# Patient Record
Sex: Female | Born: 1955
Health system: Southern US, Community
[De-identification: ages and names within clinical notes are randomized; demographics above are authoritative.]

## PROBLEM LIST (undated history)

## (undated) DIAGNOSIS — Z8489 Family history of other specified conditions: Secondary | ICD-10-CM

## (undated) DIAGNOSIS — F329 Major depressive disorder, single episode, unspecified: Secondary | ICD-10-CM

## (undated) DIAGNOSIS — F419 Anxiety disorder, unspecified: Secondary | ICD-10-CM

## (undated) DIAGNOSIS — B009 Herpesviral infection, unspecified: Secondary | ICD-10-CM

## (undated) DIAGNOSIS — M719 Bursopathy, unspecified: Secondary | ICD-10-CM

## (undated) DIAGNOSIS — C801 Malignant (primary) neoplasm, unspecified: Secondary | ICD-10-CM

## (undated) DIAGNOSIS — S42409A Unspecified fracture of lower end of unspecified humerus, initial encounter for closed fracture: Secondary | ICD-10-CM

## (undated) DIAGNOSIS — S329XXA Fracture of unspecified parts of lumbosacral spine and pelvis, initial encounter for closed fracture: Secondary | ICD-10-CM

## (undated) DIAGNOSIS — M069 Rheumatoid arthritis, unspecified: Secondary | ICD-10-CM

## (undated) DIAGNOSIS — F32A Depression, unspecified: Secondary | ICD-10-CM

## (undated) DIAGNOSIS — M810 Age-related osteoporosis without current pathological fracture: Secondary | ICD-10-CM

## (undated) HISTORY — DX: Age-related osteoporosis without current pathological fracture: M81.0

## (undated) HISTORY — DX: Bursopathy, unspecified: M71.9

## (undated) HISTORY — DX: Herpesviral infection, unspecified: B00.9

## (undated) HISTORY — PX: SPINE SURGERY: SHX786

## (undated) HISTORY — PX: OTHER SURGICAL HISTORY: SHX169

## (undated) HISTORY — PX: BREAST SURGERY: SHX581

## (undated) HISTORY — DX: Fracture of unspecified parts of lumbosacral spine and pelvis, initial encounter for closed fracture: S32.9XXA

## (undated) HISTORY — DX: Unspecified fracture of lower end of unspecified humerus, initial encounter for closed fracture: S42.409A

---

## 1997-05-17 ENCOUNTER — Ambulatory Visit (HOSPITAL_COMMUNITY): Admission: RE | Admit: 1997-05-17 | Discharge: 1997-05-17 | Payer: Self-pay | Admitting: *Deleted

## 1997-06-04 ENCOUNTER — Other Ambulatory Visit: Admission: RE | Admit: 1997-06-04 | Discharge: 1997-06-04 | Payer: Self-pay | Admitting: *Deleted

## 2003-03-20 ENCOUNTER — Other Ambulatory Visit: Admission: RE | Admit: 2003-03-20 | Discharge: 2003-03-20 | Payer: Self-pay | Admitting: *Deleted

## 2003-11-21 ENCOUNTER — Other Ambulatory Visit: Admission: RE | Admit: 2003-11-21 | Discharge: 2003-11-21 | Payer: Self-pay | Admitting: *Deleted

## 2003-12-28 ENCOUNTER — Encounter: Admission: RE | Admit: 2003-12-28 | Discharge: 2003-12-28 | Payer: Self-pay | Admitting: General Surgery

## 2004-01-02 ENCOUNTER — Encounter (INDEPENDENT_AMBULATORY_CARE_PROVIDER_SITE_OTHER): Payer: Self-pay | Admitting: *Deleted

## 2004-01-02 ENCOUNTER — Encounter: Admission: RE | Admit: 2004-01-02 | Discharge: 2004-01-02 | Payer: Self-pay | Admitting: General Surgery

## 2004-01-20 HISTORY — PX: MASTECTOMY: SHX3

## 2004-04-09 ENCOUNTER — Ambulatory Visit: Payer: Self-pay | Admitting: Oncology

## 2004-04-28 ENCOUNTER — Other Ambulatory Visit: Admission: RE | Admit: 2004-04-28 | Discharge: 2004-04-28 | Payer: Self-pay | Admitting: *Deleted

## 2005-06-03 ENCOUNTER — Other Ambulatory Visit: Admission: RE | Admit: 2005-06-03 | Discharge: 2005-06-03 | Payer: Self-pay | Admitting: *Deleted

## 2008-01-20 HISTORY — PX: OTHER SURGICAL HISTORY: SHX169

## 2008-05-03 ENCOUNTER — Emergency Department (HOSPITAL_COMMUNITY): Admission: EM | Admit: 2008-05-03 | Discharge: 2008-05-03 | Payer: Self-pay | Admitting: Emergency Medicine

## 2008-05-04 ENCOUNTER — Encounter: Payer: Self-pay | Admitting: Gynecology

## 2008-05-04 ENCOUNTER — Ambulatory Visit: Payer: Self-pay | Admitting: Gynecology

## 2008-05-04 ENCOUNTER — Other Ambulatory Visit: Admission: RE | Admit: 2008-05-04 | Discharge: 2008-05-04 | Payer: Self-pay | Admitting: Gynecology

## 2008-05-07 ENCOUNTER — Ambulatory Visit: Payer: Self-pay | Admitting: Gynecology

## 2008-05-08 ENCOUNTER — Ambulatory Visit: Payer: Self-pay | Admitting: Gynecology

## 2008-06-19 ENCOUNTER — Ambulatory Visit: Payer: Self-pay | Admitting: Gynecology

## 2008-06-22 ENCOUNTER — Ambulatory Visit: Payer: Self-pay | Admitting: Gynecology

## 2008-06-22 ENCOUNTER — Ambulatory Visit (HOSPITAL_BASED_OUTPATIENT_CLINIC_OR_DEPARTMENT_OTHER): Admission: RE | Admit: 2008-06-22 | Discharge: 2008-06-22 | Payer: Self-pay | Admitting: Gynecology

## 2008-06-22 ENCOUNTER — Encounter: Payer: Self-pay | Admitting: Gynecology

## 2008-06-29 ENCOUNTER — Ambulatory Visit: Payer: Self-pay | Admitting: Gynecology

## 2008-07-17 ENCOUNTER — Ambulatory Visit: Payer: Self-pay | Admitting: Gynecology

## 2010-06-03 NOTE — H&P (Signed)
NAME:  Carla Lamb, ROLLO                ACCOUNT NO.:  [id number]   MEDICAL RECORD NO.:  [id number]          PATIENT TYPE:  AMB   LOCATION:  [facility]                         FACILITY:  [facility]   PHYSICIAN:  Timothy P. Fontaine, M.D.DATE OF BIRTH:  1955-07-23   DATE OF ADMISSION:  DATE OF DISCHARGE:                              HISTORY & PHYSICAL   Being admitted to [facility] for surgery June 4 at 7:30  a.m.   CHIEF COMPLAINT:  Vaginal cyst, labial hypertrophy.   HISTORY OF PRESENT ILLNESS:  A 55 year old G5, P5 female, postmenopausal  x 4 years presents for annual exam.  The patient notes that she is  having dyspareunia due to left labial enlargement which has  progressively gotten worse over the past several years.  The patient  also notes that she see something protruding from her vagina when she is  going to bathroom and straining that is not painful but has not been  noticed previously.  Evaluations show a generous 2 cm left vaginal  submucosal cyst that goes to the introitus with straining and  enlargement of her left labia minora consistent with her history of  enlargement with pain with intercourse.  The patient is admitted for  excision of the vaginal cyst and left labial reduction.   PAST MEDICAL HISTORY:  Significant for rheumatoid arthritis and history  of breast cancer.   PAST SURGICAL HISTORY:  Includes a right mastectomy with reconstruction  and a bilateral bunionectomy.   CURRENT MEDICATIONS:  1. Zoloft 100 mg daily.  2. Meloxicam 7.5 mg daily.  3. Methotrexate 2.5 mg tablets 8 per week.  4. Folic acid 1 mg daily.   ALLERGIES TO MEDICATIONS:  None.   REVIEW OF SYSTEMS:  Noncontributory.   FAMILY HISTORY:  Noncontributory.   SOCIAL HISTORY:  Noncontributory.   PHYSICAL EXAM:  VITAL SIGNS:  Afebrile.  Vital signs stable.  HEENT: Normal.  LUNGS:  Clear.  CARDIAC:  Regular rate.  No rubs, murmurs or gallops.  ABDOMINAL:  Exam benign.  PELVIC:   External exam with left labial minora hypertrophy.  No specific  lesions or abnormalities, but generalized enlargement of the labia.  Vaginal exam with a submucosal left mid vaginal wall cyst approximately  2 cm across which a does protrude from introitus with straining.  Cervix  grossly normal.  Bimanual, uterus normal size, midline mobile,  nontender.  Adnexa without masses or tenderness.   ASSESSMENT:  A 55 year old G5, P57 female, postmenopausal with history  breast cancer and rheumatoid arthritis with a symptomatic vaginal cyst  she sees and feels whenever straining and a symptomatic left labial  hypertrophy with dyspareunia with intercourse.  The patient is admitted  for excision of the cyst and reduction of the labia.  I reviewed the  proposed surgery with the patient, the acute intraoperative  postoperative courses, postoperative care as well as the acute and long-  term risks.  I discussed with her with the vaginal cyst that my plan  will be to unroof the cyst, excise as much of the cyst wall as I can,  but  I will not go aggressively into the vaginal side wall to try to  excise all of the cyst.  If it does not come out easily then I will  unroof and send a good sample to pathology but leave the remaining to  granulate in and heal on its own.  I also reviewed the labial reduction  that I will attempt to match her left labia to her right labia as best  as I can, but that they will be probably asymmetrical and she  understands and accepts this.  The acute risks of the procedure were  reviewed to include the risk of infection, abscess formation requiring  reoperation, abscess drainage, hematoma formation requiring opening and  draining of hematomas, particularly with the labia, but also in the  vaginal sidewall, breakdown of the incisions postoperatively requiring  leaving it open and closure by secondary intention.  The risk of damage  to surrounding tissues including bowel, such as  the rectum, sigmoid  colon, bladder, pelvic sidewall structures, ureters and large vessels as  well as on the vulva to include damage to the peri-clitoral or clitoral  region requiring future surgeries to include bladder or bowel repair,  ureteral damage repair up to including ostomy formation.  Longer-term  issues such as persistent dyspareunia following the procedure  particularly with the labial incisions and vaginal wall incisions as  well as the potential for orgasmic dysfunction with the peri-clitoral  surgery was all discussed, understood and accepted.  The patient's  questions were answered to her satisfaction.  She is ready to proceed  with surgery.      Timothy P. Fontaine, M.D.  Electronically Signed     TPF/MEDQ  D:  06/19/2008  T:  06/19/2008  Job:  [id number]

## 2010-06-03 NOTE — Op Note (Signed)
NAME:  Carla Lamb, Carla Lamb                ACCOUNT NO.:  [id number]   MEDICAL RECORD NO.:  [id number]          PATIENT TYPE:  AMB   LOCATION:  [facility]                         FACILITY:  [facility]   PHYSICIAN:  Timothy P. Fontaine, M.D.DATE OF BIRTH:  13-Jul-1955   DATE OF PROCEDURE:  06/22/2008  DATE OF DISCHARGE:                               OPERATIVE REPORT   PREOPERATIVE DIAGNOSES:  Vaginal cyst, left labial hypertrophy,  dyspareunia.   POSTOPERATIVE DIAGNOSES:  Vaginal cyst, left labial hypertrophy,  dyspareunia.   PROCEDURE:  Excision vaginal cyst, left labial reduction.   SURGEON:  Fontaine.   ANESTHETIC:  General with local 0.25% Marcaine.   SPECIMEN:  1. Vaginal cyst.  2. Portion of left labia.   COMPLICATIONS:  None.   ESTIMATED BLOOD LOSS:  Minimal.   FINDINGS:  EUA external with left labial hypertrophy.  Vaginal exam with  large left lateral to midline vaginal cyst.  Cervix grossly normal.  Uterus normal size, midline and mobile.  Adnexa without masses.   DESCRIPTION OF PROCEDURE:  The patient was taken to the operating room,  underwent general anesthesia, was placed in the dorsal lithotomy  position, received a perineal vaginal preparation with Betadine solution  and was draped in the usual fashion.  The base of the left labia was  injected using 0.25% Marcaine approximately 2 mL total.  Attention was  then turned to the vaginal cyst to allow the Marcaine to disperse and  the vaginal cyst was inspected.  It was grasped with an Allis clamp and  at the junction of the elevation of the cyst to the surrounding vaginal  tissue the vaginal mucosa was sharply incised.  The cyst entered with  mucoid material extruding.  The cyst was incised along the margin to the  surrounding normal vagina in a circumferential manner and the vaginal  mucosa overlying with the cyst wall was excised.  The remaining  underlying cyst wall was small and it was felt most appropriate to go  ahead and  excise the remaining cyst wall.  This was undermined and the  cyst was totally excised.  Several small bleeder points were noted along  the mucosa and these were addressed with electrocautery.  A digital  rectal exam was performed to assure rectal integrity and it was noted to  be intact.  The surgeon regloved and the vaginal mucosa was then  reapproximated using 3-0 Vicryl in a running interlocking stitch  incorporating the underlying submucosal tissue to close the dead space.  The vagina was then packed with a sponge.  Attention was turned to the  labial reductive surgery.  The labia was exposed and using a needle tip  Bovie the excess labial tissue was excised to match the labia on the  right.  Interrupted 3-0 chromic sutures were then placed along the cut  surface to achieve ultimate hemostasis and reapproximate the skin.  The  vaginal packing was then removed and hemostasis was visualized at  the mucosal incision.  The patient was subsequently placed in the supine  position, awakened without difficulty and taken to the recovery room  in  good condition having tolerated the procedure well.  Both specimens from  the vaginal cyst and the labial excess tissue were sent to pathology.      Timothy P. Fontaine, M.D.  Electronically Signed     TPF/MEDQ  D:  06/22/2008  T:  06/22/2008  Job:  [id number]

## 2011-03-18 ENCOUNTER — Encounter (HOSPITAL_COMMUNITY): Payer: Self-pay | Admitting: Cardiology

## 2011-03-18 ENCOUNTER — Emergency Department (HOSPITAL_COMMUNITY)
Admission: EM | Admit: 2011-03-18 | Discharge: 2011-03-18 | Disposition: A | Discharge status: Home Health | Payer: BC Managed Care – PPO | Source: Home / Self Care | Attending: Family Medicine | Admitting: Family Medicine

## 2011-03-18 DIAGNOSIS — Z23 Encounter for immunization: Secondary | ICD-10-CM

## 2011-03-18 DIAGNOSIS — IMO0002 Reserved for concepts with insufficient information to code with codable children: Secondary | ICD-10-CM

## 2011-03-18 DIAGNOSIS — T07XXXA Unspecified multiple injuries, initial encounter: Secondary | ICD-10-CM

## 2011-03-18 HISTORY — DX: Rheumatoid arthritis, unspecified: M06.9

## 2011-03-18 MED ORDER — TETANUS-DIPHTH-ACELL PERTUSSIS 5-2.5-18.5 LF-MCG/0.5 IM SUSP
INTRAMUSCULAR | Status: AC
  Filled 2011-03-18: qty 0.5

## 2011-03-18 MED ORDER — TETANUS-DIPHTH-ACELL PERTUSSIS 5-2.5-18.5 LF-MCG/0.5 IM SUSP
0.5000 mL | Freq: Once | INTRAMUSCULAR | Status: AC
  Administered 2011-03-18: 0.5 mL via INTRAMUSCULAR

## 2011-03-18 NOTE — ED Provider Notes (Signed)
History     CSN: [id number]  Arrival date & time 03/18/11  1646   First MD Initiated Contact with Patient 03/18/11 1701      Chief Complaint  Patient presents with  . Fall  . Abrasion    (Consider location/radiation/quality/duration/timing/severity/associated sxs/prior treatment) Patient is a 56 y.o. female presenting with fall. The history is provided by the patient.  Fall The accident occurred 3 to 5 hours ago. The fall occurred while walking (thinks she tripped on her pant leg and fell onto sidewalk.,no loc, multiple abrasions.). She landed on concrete. The volume of blood lost was minimal. The point of impact was the head, right elbow, left knee and right knee. The patient is experiencing no pain. She was ambulatory at the scene. There was no alcohol use involved in the accident. Pertinent negatives include no visual change, no abdominal pain and no loss of consciousness.    Past Medical History  Diagnosis Date  . RA (rheumatoid arthritis)     Past Surgical History  Procedure Date  . Mastectomy 2006    Right  . Bunions     bilat feet    History reviewed. No pertinent family history.  History  Substance Use Topics  . Smoking status: Former Smoker    Quit date: 01/19/1978  . Smokeless tobacco: Not on file  . Alcohol Use: Yes     occas    OB History    Grav Para Term Preterm Abortions TAB SAB Ect Mult Living                  Review of Systems  Constitutional: Negative.   Cardiovascular: Negative for chest pain.  Gastrointestinal: Negative for abdominal pain.  Genitourinary: Negative for pelvic pain.  Musculoskeletal: Negative for back pain.  Skin: Positive for rash and wound.  Neurological: Negative.  Negative for loss of consciousness.    Allergies  Review of patient's allergies indicates no known allergies.  Home Medications   Current Outpatient Rx  Name Route Sig Dispense Refill  . FOLIC ACID 1 MG PO TABS Oral Take 1 mg by mouth daily. 2 tablets  once daily    . MELOXICAM 7.5 MG PO TABS Oral Take 7.5 mg by mouth daily.    Marland Kitchen METHOTREXATE 2.5 MG PO TABS Oral Take 2.5 mg by mouth once a week. Caution:Chemotherapy. Protect from light. Takes 7 tablets once weekly    . SERTRALINE HCL 25 MG PO TABS Oral Take 25 mg by mouth daily.      BP 147/93  Pulse 86  Temp(Src) 98.9 F (37.2 C) (Oral)  Resp 16  SpO2 98%  Physical Exam  Nursing note and vitals reviewed. Constitutional: She is oriented to person, place, and time. She appears well-developed and well-nourished.  HENT:  Head: Normocephalic.  Right Ear: External ear normal.  Left Ear: External ear normal.  Mouth/Throat: Oropharynx is clear and moist.       Contusion to upper lip, teeth and mandible intact. Abrasion to nose, nares intact., no bony tenderness or deformity.  Eyes: Conjunctivae are normal. Pupils are equal, round, and reactive to light.  Neck: Normal range of motion.  Cardiovascular: Normal rate and normal heart sounds.   Pulmonary/Chest: Breath sounds normal. She exhibits no tenderness.  Abdominal: Soft. Bowel sounds are normal.  Musculoskeletal: Normal range of motion.       Abrasions to knees, right elbow,left thumb, and chin.  Neurological: She is alert and oriented to person, place, and time.  Skin:  Skin is warm and dry.  Psychiatric: She has a normal mood and affect. Her behavior is normal.    ED Course  Procedures (including critical care time)  Labs Reviewed - No data to display No results found.   1. Abrasions of multiple sites       MDM          Barkley Bruns, MD 03/18/11 [phone]

## 2011-03-18 NOTE — Discharge Instructions (Signed)
Wash areas and use bacitracin ointment as needed, rinse mouth with salt water as needed, advil or tylenol for soreness, return if any problems.

## 2011-03-18 NOTE — ED Notes (Signed)
Pt fell today for unknown reason approx 2pm. Denies LOC or dizzines. Abrasions to upper lip, nose, chin, right elbow, left thumb, and bilt knees. Pt immediately cleaned abrasions with cold water. Upper lip and nose noted to be swollen. Pt reports some relief of discomfort from ice pack. Took Ibuprofen before arrival to UC. Unknown date of last tetanus.

## 2011-07-13 ENCOUNTER — Other Ambulatory Visit: Payer: Self-pay | Admitting: Neurosurgery

## 2011-08-21 ENCOUNTER — Encounter (HOSPITAL_COMMUNITY): Payer: Self-pay | Admitting: Pharmacy Technician

## 2011-08-21 NOTE — Pre-Procedure Instructions (Addendum)
20 Carla Lamb  08/21/2011   Your procedure is scheduled on:  Tuesday, August 6th.  Report to [facility] at 5:30AM.  Call this number if you have problems the morning of surgery: [phone]   Remember:   Do not eat food or anything to drink:After Midnight.     Take these medicines the morning of surgery with A SIP OF WATER: Sertraline (Zoloft).  Stop taking Meloxicam (Mobic) and any herbal medications. Do not take any Aspirin or NSAID products.   Do not wear jewelry, make-up or nail polish.  Do not wear lotions, powders, or perfumes. You may wear deodorant.  Do not shave 48 hours prior to surgery. Men may shave face and neck.  Do not bring valuables to the hospital.  Contacts, dentures or bridgework may not be worn into surgery.  Leave suitcase in the car. After surgery it may be brought to your room.  For patients admitted to the hospital, checkout time is 11:00 AM the day of discharge.   Patients discharged the day of surgery will not be allowed to drive home.  Name and phone number of your driver: na  Special Instructions: CHG Shower Use Special Wash: 1/2 bottle night before surgery and 1/2 bottle morning of surgery.   Please read over the following fact sheets that you were given: Pain Booklet, Coughing and Deep Breathing and Surgical Site Infection Prevention

## 2011-08-24 ENCOUNTER — Encounter (HOSPITAL_COMMUNITY): Payer: Self-pay

## 2011-08-24 ENCOUNTER — Encounter (HOSPITAL_COMMUNITY)
Admission: RE | Admit: 2011-08-24 | Discharge: 2011-08-24 | Disposition: A | Discharge status: Home / Self Care | Payer: BC Managed Care – PPO | Source: Ambulatory Visit | Attending: Neurosurgery | Admitting: Neurosurgery

## 2011-08-24 HISTORY — DX: Anxiety disorder, unspecified: F41.9

## 2011-08-24 HISTORY — DX: Malignant (primary) neoplasm, unspecified: C80.1

## 2011-08-24 HISTORY — DX: Major depressive disorder, single episode, unspecified: F32.9

## 2011-08-24 HISTORY — DX: Depression, unspecified: F32.A

## 2011-08-24 LAB — BASIC METABOLIC PANEL
BUN: 17 mg/dL (ref 6–23)
CO2: 28 mEq/L (ref 19–32)
Calcium: 9.7 mg/dL (ref 8.4–10.5)
Chloride: 101 mEq/L (ref 96–112)
Creatinine, Ser: 0.79 mg/dL (ref 0.50–1.10)
GFR calc Af Amer: >90 mL/min (ref >90)
GFR calc non Af Amer: >90 mL/min (ref >90)
Glucose, Bld: 94 mg/dL (ref 70–99)
Potassium: 4.7 mEq/L (ref 3.5–5.1)
Sodium: 136 mEq/L (ref 135–145)

## 2011-08-24 LAB — CBC
HCT: 42.1 % (ref 36.0–46.0)
Hemoglobin: 14.4 g/dL (ref 12.0–15.0)
MCH: 33.2 pg (ref 26.0–34.0)
MCHC: 34.2 g/dL (ref 30.0–36.0)
MCV: 97 fL (ref 78.0–100.0)
Platelets: 275 10*3/uL (ref 150–400)
RBC: 4.34 MIL/uL (ref 3.87–5.11)
RDW: 13.2 % (ref 11.5–15.5)
WBC: 6.6 10*3/uL (ref 4.0–10.5)

## 2011-08-24 LAB — SURGICAL PCR SCREEN
MRSA, PCR: NEGATIVE
Staphylococcus aureus: NEGATIVE

## 2011-08-24 MED ORDER — CEFAZOLIN SODIUM-DEXTROSE 2-3 GM-% IV SOLR
2.0000 g | INTRAVENOUS | Status: AC
  Administered 2011-08-25: 2 g via INTRAVENOUS

## 2011-08-25 ENCOUNTER — Encounter (HOSPITAL_COMMUNITY): Payer: Self-pay | Admitting: *Deleted

## 2011-08-25 ENCOUNTER — Ambulatory Visit (HOSPITAL_COMMUNITY): Payer: BC Managed Care – PPO

## 2011-08-25 ENCOUNTER — Encounter (HOSPITAL_COMMUNITY)
Admission: RE | Disposition: A | Discharge status: Home / Self Care | Payer: Self-pay | Source: Ambulatory Visit | Attending: Neurosurgery

## 2011-08-25 ENCOUNTER — Encounter (HOSPITAL_COMMUNITY): Payer: Self-pay | Admitting: General Practice

## 2011-08-25 ENCOUNTER — Encounter (HOSPITAL_COMMUNITY): Payer: Self-pay | Admitting: Certified Registered"

## 2011-08-25 ENCOUNTER — Inpatient Hospital Stay (HOSPITAL_COMMUNITY)
Admission: RE | Admit: 2011-08-25 | Discharge: 2011-08-27 | DRG: 865 | Disposition: A | Discharge status: Home / Self Care | Payer: BC Managed Care – PPO | Source: Ambulatory Visit | Attending: Neurosurgery | Admitting: Neurosurgery

## 2011-08-25 ENCOUNTER — Ambulatory Visit (HOSPITAL_COMMUNITY): Payer: BC Managed Care – PPO | Admitting: Certified Registered"

## 2011-08-25 DIAGNOSIS — Z87891 Personal history of nicotine dependence: Secondary | ICD-10-CM

## 2011-08-25 DIAGNOSIS — Z853 Personal history of malignant neoplasm of breast: Secondary | ICD-10-CM

## 2011-08-25 DIAGNOSIS — M47812 Spondylosis without myelopathy or radiculopathy, cervical region: Principal | ICD-10-CM | POA: Diagnosis present

## 2011-08-25 DIAGNOSIS — M069 Rheumatoid arthritis, unspecified: Secondary | ICD-10-CM | POA: Diagnosis present

## 2011-08-25 DIAGNOSIS — F329 Major depressive disorder, single episode, unspecified: Secondary | ICD-10-CM | POA: Diagnosis present

## 2011-08-25 DIAGNOSIS — Z01812 Encounter for preprocedural laboratory examination: Secondary | ICD-10-CM

## 2011-08-25 DIAGNOSIS — F3289 Other specified depressive episodes: Secondary | ICD-10-CM | POA: Diagnosis present

## 2011-08-25 DIAGNOSIS — M5030 Other cervical disc degeneration, unspecified cervical region: Secondary | ICD-10-CM | POA: Diagnosis present

## 2011-08-25 HISTORY — PX: CERVICAL FUSION: SHX112

## 2011-08-25 HISTORY — PX: ANTERIOR CERVICAL DECOMP/DISCECTOMY FUSION: SHX1161

## 2011-08-25 SURGERY — ANTERIOR CERVICAL DECOMPRESSION/DISCECTOMY FUSION 3 LEVELS
Anesthesia: General | Site: Neck | Wound class: Clean

## 2011-08-25 MED ORDER — CEFAZOLIN SODIUM-DEXTROSE 2-3 GM-% IV SOLR
INTRAVENOUS | Status: AC
  Filled 2011-08-25: qty 50

## 2011-08-25 MED ORDER — HYDROMORPHONE HCL PF 1 MG/ML IJ SOLN
INTRAMUSCULAR | Status: AC
  Filled 2011-08-25: qty 1

## 2011-08-25 MED ORDER — METHOTREXATE 2.5 MG PO TABS: 17.5000 mg | ORAL_TABLET | ORAL | Status: DC

## 2011-08-25 MED ORDER — LIDOCAINE HCL (CARDIAC) 20 MG/ML IV SOLN
INTRAVENOUS | Status: DC | PRN
  Administered 2011-08-25: 40 mg via INTRAVENOUS

## 2011-08-25 MED ORDER — THROMBIN 20000 UNITS EX KIT
PACK | CUTANEOUS | Status: DC | PRN
  Administered 2011-08-25: 20000 [IU] via TOPICAL

## 2011-08-25 MED ORDER — HEMOSTATIC AGENTS (NO CHARGE) OPTIME
TOPICAL | Status: DC | PRN
  Administered 2011-08-25: 1 via TOPICAL

## 2011-08-25 MED ORDER — DEXAMETHASONE 4 MG PO TABS
4.0000 mg | ORAL_TABLET | Freq: Four times a day (QID) | ORAL | Status: DC
  Administered 2011-08-25 – 2011-08-27 (×8): 4 mg via ORAL
  Filled 2011-08-25 (×12): qty 1

## 2011-08-25 MED ORDER — SUFENTANIL CITRATE 50 MCG/ML IV SOLN
INTRAVENOUS | Status: DC | PRN
  Administered 2011-08-25 (×2): 5 ug via INTRAVENOUS
  Administered 2011-08-25: 30 ug via INTRAVENOUS

## 2011-08-25 MED ORDER — PROMETHAZINE HCL 25 MG/ML IJ SOLN: 6.2500 mg | INTRAMUSCULAR | Status: DC | PRN

## 2011-08-25 MED ORDER — ACETAMINOPHEN 650 MG RE SUPP: 650.0000 mg | RECTAL | Status: DC | PRN

## 2011-08-25 MED ORDER — PHENOL 1.4 % MT LIQD: 1.0000 | OROMUCOSAL | Status: DC | PRN

## 2011-08-25 MED ORDER — DIAZEPAM 5 MG PO TABS
5.0000 mg | ORAL_TABLET | Freq: Four times a day (QID) | ORAL | Status: DC | PRN
  Administered 2011-08-25: 5 mg via ORAL
  Filled 2011-08-25: qty 1

## 2011-08-25 MED ORDER — DEXAMETHASONE SODIUM PHOSPHATE 4 MG/ML IJ SOLN
4.0000 mg | Freq: Four times a day (QID) | INTRAMUSCULAR | Status: DC
  Filled 2011-08-25 (×9): qty 1

## 2011-08-25 MED ORDER — MIDAZOLAM HCL 5 MG/5ML IJ SOLN
INTRAMUSCULAR | Status: DC | PRN
  Administered 2011-08-25: 2 mg via INTRAVENOUS

## 2011-08-25 MED ORDER — MENTHOL 3 MG MT LOZG
1.0000 | LOZENGE | OROMUCOSAL | Status: DC | PRN
  Administered 2011-08-25: 3 mg via ORAL
  Filled 2011-08-25: qty 9

## 2011-08-25 MED ORDER — OXYCODONE-ACETAMINOPHEN 5-325 MG PO TABS
1.0000 | ORAL_TABLET | ORAL | Status: DC | PRN
  Administered 2011-08-25 – 2011-08-26 (×3): 2 via ORAL
  Filled 2011-08-25 (×3): qty 2

## 2011-08-25 MED ORDER — SERTRALINE HCL 25 MG PO TABS
12.5000 mg | ORAL_TABLET | Freq: Every day | ORAL | Status: DC
  Administered 2011-08-25: 12.5 mg via ORAL
  Filled 2011-08-25 (×4): qty 0.5

## 2011-08-25 MED ORDER — SODIUM CHLORIDE 0.9 % IV SOLN: 250.0000 mL | INTRAVENOUS | Status: DC

## 2011-08-25 MED ORDER — SODIUM CHLORIDE 0.9 % IV SOLN
INTRAVENOUS | Status: DC
  Administered 2011-08-25: 19:00:00 via INTRAVENOUS

## 2011-08-25 MED ORDER — 0.9 % SODIUM CHLORIDE (POUR BTL) OPTIME
TOPICAL | Status: DC | PRN
  Administered 2011-08-25: 1000 mL

## 2011-08-25 MED ORDER — HYDROMORPHONE HCL PF 1 MG/ML IJ SOLN
0.2500 mg | INTRAMUSCULAR | Status: DC | PRN
  Administered 2011-08-25 (×2): 0.5 mg via INTRAVENOUS

## 2011-08-25 MED ORDER — ACETAMINOPHEN 325 MG PO TABS: 650.0000 mg | ORAL_TABLET | ORAL | Status: DC | PRN

## 2011-08-25 MED ORDER — ONDANSETRON HCL 4 MG/2ML IJ SOLN: 4.0000 mg | INTRAMUSCULAR | Status: DC | PRN

## 2011-08-25 MED ORDER — MEPERIDINE HCL 25 MG/ML IJ SOLN: 6.2500 mg | INTRAMUSCULAR | Status: DC | PRN

## 2011-08-25 MED ORDER — MIDAZOLAM HCL 2 MG/2ML IJ SOLN: 0.5000 mg | Freq: Once | INTRAMUSCULAR | Status: DC | PRN

## 2011-08-25 MED ORDER — DIAZEPAM 5 MG PO TABS
ORAL_TABLET | ORAL | Status: AC
  Filled 2011-08-25: qty 1

## 2011-08-25 MED ORDER — SODIUM CHLORIDE 0.9 % IJ SOLN: 3.0000 mL | INTRAMUSCULAR | Status: DC | PRN

## 2011-08-25 MED ORDER — MORPHINE SULFATE 4 MG/ML IJ SOLN
4.0000 mg | INTRAMUSCULAR | Status: DC | PRN
  Administered 2011-08-25 (×2): 4 mg via INTRAVENOUS
  Filled 2011-08-25 (×2): qty 1

## 2011-08-25 MED ORDER — PROPOFOL 10 MG/ML IV EMUL
INTRAVENOUS | Status: DC | PRN
  Administered 2011-08-25: 120 mg via INTRAVENOUS

## 2011-08-25 MED ORDER — THROMBIN 5000 UNITS EX SOLR
OROMUCOSAL | Status: DC | PRN
  Administered 2011-08-25: 09:00:00 via TOPICAL

## 2011-08-25 MED ORDER — CEFAZOLIN SODIUM 1-5 GM-% IV SOLN
1.0000 g | Freq: Three times a day (TID) | INTRAVENOUS | Status: AC
  Administered 2011-08-25 (×2): 1 g via INTRAVENOUS
  Filled 2011-08-25 (×2): qty 50

## 2011-08-25 MED ORDER — SODIUM CHLORIDE 0.9 % IJ SOLN
3.0000 mL | Freq: Two times a day (BID) | INTRAMUSCULAR | Status: DC
  Administered 2011-08-26 (×3): 3 mL via INTRAVENOUS

## 2011-08-25 MED ORDER — ROCURONIUM BROMIDE 100 MG/10ML IV SOLN
INTRAVENOUS | Status: DC | PRN
  Administered 2011-08-25: 50 mg via INTRAVENOUS

## 2011-08-25 MED ORDER — LACTATED RINGERS IV SOLN
INTRAVENOUS | Status: DC | PRN
  Administered 2011-08-25 (×2): via INTRAVENOUS

## 2011-08-25 MED ORDER — DEXAMETHASONE SODIUM PHOSPHATE 4 MG/ML IJ SOLN
INTRAMUSCULAR | Status: DC | PRN
  Administered 2011-08-25: 10 mg via INTRAVENOUS

## 2011-08-25 MED ORDER — PHENYLEPHRINE HCL 10 MG/ML IJ SOLN
INTRAMUSCULAR | Status: DC | PRN
  Administered 2011-08-25 (×2): 40 ug via INTRAVENOUS
  Administered 2011-08-25 (×2): 80 ug via INTRAVENOUS
  Administered 2011-08-25: 40 ug via INTRAVENOUS

## 2011-08-25 MED ORDER — ONDANSETRON HCL 4 MG/2ML IJ SOLN
INTRAMUSCULAR | Status: DC | PRN
  Administered 2011-08-25: 4 mg via INTRAVENOUS

## 2011-08-25 SURGICAL SUPPLY — 71 items
BANDAGE GAUZE ELAST BULKY 4 IN (GAUZE/BANDAGES/DRESSINGS) ×4 IMPLANT
BENZOIN TINCTURE PRP APPL 2/3 (GAUZE/BANDAGES/DRESSINGS) ×2 IMPLANT
BIT DRILL SM SPINE QC 12 (BIT) ×2 IMPLANT
BLADE ULTRA TIP 2M (BLADE) ×2 IMPLANT
BUR BARREL STRAIGHT FLUTE 4.0 (BURR) IMPLANT
BUR MATCHSTICK NEURO 3.0 LAGG (BURR) ×2 IMPLANT
CANISTER SUCTION 2500CC (MISCELLANEOUS) ×2 IMPLANT
CLOTH BEACON ORANGE TIMEOUT ST (SAFETY) ×2 IMPLANT
CONT SPEC 4OZ CLIKSEAL STRL BL (MISCELLANEOUS) ×2 IMPLANT
COVER MAYO STAND STRL (DRAPES) ×2 IMPLANT
DRAIN JACKSON PRATT 10MM FLAT (MISCELLANEOUS) ×2 IMPLANT
DRAPE LAPAROTOMY 100X72 PEDS (DRAPES) ×2 IMPLANT
DRAPE MICROSCOPE LEICA (MISCELLANEOUS) ×2 IMPLANT
DRAPE POUCH INSTRU U-SHP 10X18 (DRAPES) ×2 IMPLANT
DRAPE PROXIMA HALF (DRAPES) IMPLANT
DURAPREP 6ML APPLICATOR 50/CS (WOUND CARE) ×2 IMPLANT
ELECT REM PT RETURN 9FT ADLT (ELECTROSURGICAL) ×2
ELECTRODE REM PT RTRN 9FT ADLT (ELECTROSURGICAL) ×1 IMPLANT
EVACUATOR SILICONE 100CC (DRAIN) ×2 IMPLANT
GAUZE SPONGE 4X4 16PLY XRAY LF (GAUZE/BANDAGES/DRESSINGS) IMPLANT
GLOVE BIO SURGEON STRL SZ 6.5 (GLOVE) IMPLANT
GLOVE BIO SURGEON STRL SZ7 (GLOVE) IMPLANT
GLOVE BIO SURGEON STRL SZ7.5 (GLOVE) IMPLANT
GLOVE BIO SURGEON STRL SZ8 (GLOVE) IMPLANT
GLOVE BIO SURGEON STRL SZ8.5 (GLOVE) IMPLANT
GLOVE BIOGEL M 8.0 STRL (GLOVE) ×2 IMPLANT
GLOVE BIOGEL PI IND STRL 8 (GLOVE) ×1 IMPLANT
GLOVE BIOGEL PI INDICATOR 8 (GLOVE) ×1
GLOVE ECLIPSE 6.5 STRL STRAW (GLOVE) IMPLANT
GLOVE ECLIPSE 7.0 STRL STRAW (GLOVE) IMPLANT
GLOVE ECLIPSE 7.5 STRL STRAW (GLOVE) ×6 IMPLANT
GLOVE ECLIPSE 8.0 STRL XLNG CF (GLOVE) IMPLANT
GLOVE ECLIPSE 8.5 STRL (GLOVE) IMPLANT
GLOVE EXAM NITRILE LRG STRL (GLOVE) IMPLANT
GLOVE EXAM NITRILE MD LF STRL (GLOVE) IMPLANT
GLOVE EXAM NITRILE XL STR (GLOVE) IMPLANT
GLOVE EXAM NITRILE XS STR PU (GLOVE) IMPLANT
GLOVE INDICATOR 6.5 STRL GRN (GLOVE) IMPLANT
GLOVE INDICATOR 7.0 STRL GRN (GLOVE) IMPLANT
GLOVE INDICATOR 7.5 STRL GRN (GLOVE) IMPLANT
GLOVE INDICATOR 8.0 STRL GRN (GLOVE) IMPLANT
GLOVE INDICATOR 8.5 STRL (GLOVE) IMPLANT
GLOVE OPTIFIT SS 8.0 STRL (GLOVE) IMPLANT
GLOVE SURG SS PI 6.5 STRL IVOR (GLOVE) IMPLANT
GOWN BRE IMP SLV AUR LG STRL (GOWN DISPOSABLE) ×2 IMPLANT
GOWN BRE IMP SLV AUR XL STRL (GOWN DISPOSABLE) ×2 IMPLANT
GOWN STRL REIN 2XL LVL4 (GOWN DISPOSABLE) ×2 IMPLANT
HEAD HALTER (SOFTGOODS) IMPLANT
HEMOSTAT POWDER KIT SURGIFOAM (HEMOSTASIS) ×2 IMPLANT
KIT BASIN OR (CUSTOM PROCEDURE TRAY) ×2 IMPLANT
KIT ROOM TURNOVER OR (KITS) ×2 IMPLANT
NEEDLE SPNL 22GX3.5 QUINCKE BK (NEEDLE) ×4 IMPLANT
NIKO spacer 18 mm (Neuro Prosthesis/Implant) ×2 IMPLANT
NS IRRIG 1000ML POUR BTL (IV SOLUTION) ×2 IMPLANT
PACK LAMINECTOMY NEURO (CUSTOM PROCEDURE TRAY) ×2 IMPLANT
PATTIES SURGICAL .5 X1 (DISPOSABLE) ×2 IMPLANT
PLATE ANT CERV XTEND 3 LV 42 (Plate) ×2 IMPLANT
PUTTY BONE GRAFT KIT 2.5ML (Bone Implant) ×2 IMPLANT
RUBBERBAND STERILE (MISCELLANEOUS) ×4 IMPLANT
SCREW XTD VAR 4.2 SELF TAP 12 (Screw) ×12 IMPLANT
SPACER ACDF SM LORDOTIC 7 (Spacer) ×2 IMPLANT
SPONGE GAUZE 4X4 12PLY (GAUZE/BANDAGES/DRESSINGS) ×2 IMPLANT
SPONGE INTESTINAL PEANUT (DISPOSABLE) ×4 IMPLANT
SPONGE SURGIFOAM ABS GEL 100 (HEMOSTASIS) ×2 IMPLANT
STRIP CLOSURE SKIN 1/2X4 (GAUZE/BANDAGES/DRESSINGS) ×2 IMPLANT
SUT VIC AB 3-0 SH 8-18 (SUTURE) ×2 IMPLANT
SYR 20ML ECCENTRIC (SYRINGE) ×2 IMPLANT
TAPE CLOTH SURG 4X10 WHT LF (GAUZE/BANDAGES/DRESSINGS) ×2 IMPLANT
TOWEL OR 17X24 6PK STRL BLUE (TOWEL DISPOSABLE) ×2 IMPLANT
TOWEL OR 17X26 10 PK STRL BLUE (TOWEL DISPOSABLE) ×2 IMPLANT
WATER STERILE IRR 1000ML POUR (IV SOLUTION) ×2 IMPLANT

## 2011-08-25 NOTE — Progress Notes (Signed)
Patient arrived to unit in no signs of acute distress. Oriented to room and equipment. Safety plan signed. Bed alarm on. Will continue to monitor.

## 2011-08-25 NOTE — Plan of Care (Signed)
Problem: Consults Goal: Diagnosis - Spinal Surgery Cervical Spine Fusion- Levels C4-7

## 2011-08-25 NOTE — Transfer of Care (Signed)
Immediate Anesthesia Transfer of Care Note  Patient: Carla Lamb  Procedure(s) Performed: Procedure(s) (LRB): ANTERIOR CERVICAL DECOMPRESSION/DISCECTOMY FUSION 3 LEVELS (N/A)  Patient Location: PACU  Anesthesia Type: General  Level of Consciousness: awake, alert  and oriented  Airway & Oxygen Therapy: Patient Spontanous Breathing and Patient connected to nasal cannula oxygen  Post-op Assessment: Report given to PACU RN, Post -op Vital signs reviewed and stable and Patient moving all extremities  Post vital signs: Reviewed and stable  Complications: No apparent anesthesia complications

## 2011-08-25 NOTE — H&P (Signed)
Carla Lamb is an 56 y.o. female.   Chief Complaint: neck HPI: neck pain with radiation to the left shoulder to the left hand associated with burning sensation.back in February of this year she fell hitting her face. She is no better with conservative treatment.  Past Medical History  Diagnosis Date  . RA (rheumatoid arthritis)   . Family history of anesthesia complication     nausea /vomitting  . Cancer     Right Mastectomy Padgets Disease  . Anxiety   . Depression     During treament    Past Surgical History  Procedure Date  . Mastectomy 2006    Right  . Bunions     bilat feet  . Vaginal cyst removed 2010    History reviewed. No pertinent family history. Social History:  reports that she quit smoking about 33 years ago. She does not have any smokeless tobacco history on file. She reports that she drinks about 7.2 ounces of alcohol per week. She reports that she does not use illicit drugs.  Allergies: No Known Allergies  Medications Prior to Admission  Medication Sig Dispense Refill  . Cholecalciferol (VITAMIN D) 2000 UNITS CAPS Take 1 capsule by mouth daily.      . folic acid (FOLVITE) 1 MG tablet Take 2 mg by mouth daily.       . meloxicam (MOBIC) 7.5 MG tablet Take 7.5 mg by mouth daily.      . methotrexate (RHEUMATREX) 2.5 MG tablet Take 17.5 mg by mouth once a week. TUESDAYS.  Caution:Chemotherapy. Protect from light. Takes 7 tablets once weekly      . sertraline (ZOLOFT) 25 MG tablet Take 12.5 mg by mouth daily.         Results for orders placed during the hospital encounter of 08/24/11 (from the past 48 hour(s))  SURGICAL PCR SCREEN     Status: Normal   Collection Time   08/24/11  8:47 AM      Component Value Range Comment   MRSA, PCR NEGATIVE  NEGATIVE    Staphylococcus aureus NEGATIVE  NEGATIVE   BASIC METABOLIC PANEL     Status: Normal   Collection Time   08/24/11  8:48 AM      Component Value Range Comment   Sodium 136  135 - 145 mEq/L    Potassium 4.7   3.5 - 5.1 mEq/L    Chloride 101  96 - 112 mEq/L    CO2 28  19 - 32 mEq/L    Glucose, Bld 94  70 - 99 mg/dL    BUN 17  6 - 23 mg/dL    Creatinine, Ser 1.30  0.50 - 1.10 mg/dL    Calcium 9.7  8.4 - 86.5 mg/dL    GFR calc non Af Amer >90  >90 mL/min    GFR calc Af Amer >90  >90 mL/min   CBC     Status: Normal   Collection Time   08/24/11  8:48 AM      Component Value Range Comment   WBC 6.6  4.0 - 10.5 K/uL    RBC 4.34  3.87 - 5.11 MIL/uL    Hemoglobin 14.4  12.0 - 15.0 g/dL    HCT 78.4  69.6 - 29.5 %    MCV 97.0  78.0 - 100.0 fL    MCH 33.2  26.0 - 34.0 pg    MCHC 34.2  30.0 - 36.0 g/dL    RDW 28.4  13.2 -  15.5 %    Platelets 275  150 - 400 K/uL    No results found.  Review of Systems  Constitutional: Negative.   HENT: Positive for neck pain.   Eyes: Negative.   Respiratory: Negative.   Cardiovascular: Negative.   Gastrointestinal: Negative.   Genitourinary: Negative.   Skin: Negative.   Neurological: Positive for focal weakness.  Endo/Heme/Allergies: Negative.   Psychiatric/Behavioral: Negative.     Blood pressure 123/80, pulse 58, temperature 97.8 F (36.6 C), resp. rate 16, SpO2 99.00%. Physical Exam  hent, nl. Neck, decrease of movement associated with pain,. Lungs, clear. Cv, nl. Abdomen.,nl. Extremities, nl.NEURO WEAKNESS of left biceps and wrist extensor. MRI LACK OF LORDOSIS.severe ddd at c45,56 and 70. stepp off at c45  Assessment/Plan Decompression and fusion at c45,56 and 67. She and hubnand are aware of risks and benefits Kemontae Dunklee M 08/25/2011, 7:36 AM

## 2011-08-25 NOTE — Progress Notes (Signed)
c45 discectomy, c6 corpectomy. Cages. Plate c4 to c7. Op note [id number]

## 2011-08-25 NOTE — Anesthesia Preprocedure Evaluation (Addendum)
Anesthesia Evaluation  Patient identified by MRN, date of birth, ID band Patient awake    Reviewed: Allergy & Precautions, H&P , NPO status , Patient's Chart, lab work & pertinent test results  History of Anesthesia Complications (+) Family history of anesthesia reactionNegative for: history of anesthetic complications  Airway Mallampati: II TM Distance: >3 FB Neck ROM: Limited    Dental No notable dental hx. (+) Teeth Intact and Dental Advisory Given   Pulmonary neg pulmonary ROS,  breath sounds clear to auscultation  Pulmonary exam normal       Cardiovascular negative cardio ROS  Rhythm:Regular Rate:Normal     Neuro/Psych Anxiety Depression    GI/Hepatic negative GI ROS, Neg liver ROS,   Endo/Other    Renal/GU negative Renal ROS     Musculoskeletal  (+) Arthritis -, Rheumatoid disorders,    Abdominal   Peds  Hematology   Anesthesia Other Findings Breast ca-s/p mastectomy and chemo  Reproductive/Obstetrics                          Anesthesia Physical Anesthesia Plan  ASA: II  Anesthesia Plan: General   Post-op Pain Management:    Induction: Intravenous  Airway Management Planned: Oral ETT and Video Laryngoscope Planned  Additional Equipment:   Intra-op Plan:   Post-operative Plan: Extubation in OR  Informed Consent: I have reviewed the patients History and Physical, chart, labs and discussed the procedure including the risks, benefits and alternatives for the proposed anesthesia with the patient or authorized representative who has indicated his/her understanding and acceptance.   Dental advisory given  Plan Discussed with: CRNA, Anesthesiologist and Surgeon  Anesthesia Plan Comments: (Plan routine monitors, GETA with VideoGlide intubation)       Anesthesia Quick Evaluation

## 2011-08-25 NOTE — Anesthesia Postprocedure Evaluation (Signed)
  Anesthesia Post-op Note  Patient: Carla Lamb  Procedure(s) Performed: Procedure(s) (LRB): ANTERIOR CERVICAL DECOMPRESSION/DISCECTOMY FUSION 3 LEVELS (N/A)  Patient Location: PACU  Anesthesia Type: General  Level of Consciousness: awake, alert  and oriented  Airway and Oxygen Therapy: Patient Spontanous Breathing  Post-op Pain: mild  Post-op Assessment: Post-op Vital signs reviewed, Patient's Cardiovascular Status Stable, Respiratory Function Stable, Patent Airway, No signs of Nausea or vomiting and Pain level controlled  Post-op Vital Signs: Reviewed and stable  Complications: No apparent anesthesia complications

## 2011-08-26 MED ORDER — SERTRALINE HCL 50 MG PO TABS
50.0000 mg | ORAL_TABLET | Freq: Every day | ORAL | Status: DC
  Administered 2011-08-26: 50 mg via ORAL
  Filled 2011-08-26 (×3): qty 1

## 2011-08-26 MED ORDER — DOCUSATE SODIUM 100 MG PO CAPS
100.0000 mg | ORAL_CAPSULE | Freq: Two times a day (BID) | ORAL | Status: DC
  Administered 2011-08-26 – 2011-08-27 (×3): 100 mg via ORAL
  Filled 2011-08-26 (×3): qty 1

## 2011-08-26 NOTE — Progress Notes (Signed)
Occupational Therapy Evaluation Patient Details Name: Carla Lamb MRN: [id number] DOB: Nov 09, 1955 Today's Date: 08/26/2011 Time: 0102-7253 OT Time Calculation (min): 14 min  OT Assessment / Plan / Recommendation Clinical Impression  Pt s/p cervical spine fusion C4-7 thus affecting PLOF.  Pt is near baseline level of functioning with ADLs and functional mobility. Educated pt on cervical precautions and home safety awareness.  Pt will have necessary level of assist at home.  No further acute OT needs.    OT Assessment  Patient does not need any further OT services    Follow Up Recommendations  No OT follow up;Supervision - Intermittent    Barriers to Discharge      Equipment Recommendations  None recommended by OT    Recommendations for Other Services    Frequency       Precautions / Restrictions Precautions Precautions: Cervical Precaution Comments: Provided handout and educated pt on cervical precautions. Required Braces or Orthoses: Cervical Brace Cervical Brace: Soft collar   Pertinent Vitals/Pain See vitals    ADL  Eating/Feeding: Performed;Independent Where Assessed - Eating/Feeding: Edge of bed Lower Body Dressing: Performed;Modified independent Where Assessed - Lower Body Dressing: Unsupported sit to stand Toilet Transfer: Simulated;Supervision/safety Toilet Transfer Method: Sit to stand (ambulated) Acupuncturist:  (EOB) Equipment Used:  (cervical collar) Transfers/Ambulation Related to ADLs: Pt ambulated throughout room with supervision due to c/o dizziness ADL Comments: Pt near baseline level of functioning.   Educated pt on cervical precautions and how to incorporate them into ADLs/IADLs upon return home.  Pt verbalizes good understanding of precautions.      OT Diagnosis:    OT Problem List:   OT Treatment Interventions:     OT Goals    Visit Information  Last OT Received On: 08/26/11    Subjective Data      Prior  Functioning  Vision/Perception  Home Living Lives With: Spouse Available Help at Discharge: Family Type of Home: House Home Access: Stairs to enter [employer] of Steps: 10 Entrance Stairs-Rails: Left Home Layout: Two level;Able to live on main level with bedroom/bathroom Bathroom Shower/Tub: Tub/shower unit;Other (comment) (walk in shower upstairs) Bathroom Toilet: Standard Home Adaptive Equipment: Hand-held shower hose Prior Function Level of Independence: Independent Able to Take Stairs?: Yes Driving: Yes Vocation: Full time employment Communication Communication: No difficulties      Cognition  Overall Cognitive Status: Appears within functional limits for tasks assessed/performed Arousal/Alertness: Awake/alert Orientation Level: Appears intact for tasks assessed Behavior During Session: [facility] for tasks performed    Extremity/Trunk Assessment Right Upper Extremity Assessment RUE ROM/Strength/Tone: Within functional levels RUE Sensation: WFL - Light Touch;WFL - Proprioception RUE Coordination: WFL - gross/fine motor Left Upper Extremity Assessment LUE ROM/Strength/Tone: Within functional levels LUE Sensation: WFL - Light Touch;WFL - Proprioception LUE Coordination: WFL - gross/fine motor   Mobility Bed Mobility Bed Mobility: Left Sidelying to Sit;Sit to Sidelying Left;Rolling Left Rolling Left: 5: Supervision Left Sidelying to Sit: 5: Supervision Sit to Sidelying Left: 5: Supervision Details for Bed Mobility Assistance: VC for log roll technique. Transfers Transfers: Sit to Stand;Stand to Sit Sit to Stand: 5: Supervision;From bed;With upper extremity assist Stand to Sit: 5: Supervision;To chair/3-in-1;With upper extremity assist Details for Transfer Assistance: Supervision for safety due to dizziness   Exercise    Balance    End of Session OT - End of Session Equipment Utilized During Treatment: Cervical collar Activity Tolerance: Patient tolerated  treatment well Patient left: in chair;with call bell/phone within reach;with family/visitor present Nurse  Communication: Mobility status  GO    08/26/2011 Cipriano Mile OTR/L Pager [phone] Office [phone]  Cipriano Mile 08/26/2011, 8:54 AM

## 2011-08-26 NOTE — Evaluation (Signed)
Physical Therapy Evaluation Patient Details Name: Carla Lamb MRN: [id number] DOB: [birth year] Today's Date: 08/26/2011 Time: 0820-0832 PT Time Calculation (min): 12 min  PT Assessment / Plan / Recommendation Clinical Impression  Pt is a 56 yo female s/p cervical fusion. Pt presents to physical therapy evaluation with only supervision to modified independence for all functional mobility. Pt's husband will be available for any assistance/supervision pt needs at discharge. No further PT needs at this time.    PT Assessment  Patent does not need any further PT services    Follow Up Recommendations  No PT follow up    Barriers to Discharge        Equipment Recommendations  None recommended by PT    Recommendations for Other Services     Frequency      Precautions / Restrictions Precautions Precautions: Cervical Precaution Comments: Pt educated on cervical precautions and encouraged to read handout provided by OT. Required Braces or Orthoses: Cervical Brace Cervical Brace: Soft collar Restrictions Weight Bearing Restrictions: No         Mobility  Bed Mobility Details for Bed Mobility Assistance: Pt sitting up in bed upon presentation but reported she was able to get into and out of bed independently. Transfers Transfers: Sit to Stand;Stand to Sit Sit to Stand: With upper extremity assist;From bed;6: Modified independent (Device/Increase time) Stand to Sit: 6: Modified independent (Device/Increase time);With upper extremity assist;To bed Details for Transfer Assistance: Pt modified independent with all transfers. Ambulation/Gait Ambulation/Gait Assistance: 6: Modified independent (Device/Increase time) Ambulation Distance (Feet): 400 Feet Assistive device: None Ambulation/Gait Assistance Details: Pt reported slight dizziness but stated that it did not currently affect her mobility. Gait Pattern: Within Functional Limits Stairs: Yes Stairs Assistance: 5: Supervision Stairs  Assistance Details (indicate cue type and reason): Pt required supervision for safety due to slight dizziness. Pt reports husband will be home with her for supervision. Stair Management Technique: One rail Left Number of Stairs: 12     Exercises     PT Diagnosis:    PT Problem List:   PT Treatment Interventions:     PT Goals    Visit Information  Last PT Received On: 08/26/11 Assistance Needed: +1    Subjective Data  Subjective: "I am doing great."   Prior Functioning  Home Living Lives With: Spouse Available Help at Discharge: Family Type of Home: House Home Access: Stairs to enter Secretary/administrator of Steps: 10 Entrance Stairs-Rails: Left Home Layout: Two level;Able to live on main level with bedroom/bathroom Bathroom Shower/Tub: Tub/shower unit;Other (comment) (walk in shower upstairs) Bathroom Toilet: Standard Home Adaptive Equipment: Hand-held shower hose Prior Function Level of Independence: Independent Able to Take Stairs?: Yes Driving: Yes Vocation: Full time employment Communication Communication: No difficulties    Cognition  Overall Cognitive Status: Appears within functional limits for tasks assessed/performed Arousal/Alertness: Awake/alert Orientation Level: Appears intact for tasks assessed Behavior During Session: [facility] for tasks performed    Extremity/Trunk Assessment Right Upper Extremity Assessment RUE ROM/Strength/Tone: [facility] for tasks assessed Left Upper Extremity Assessment LUE ROM/Strength/Tone: WFL for tasks assessed LUE Sensation:  (Pt reports tingling down LUE, which was present pre surgery) Right Lower Extremity Assessment RLE ROM/Strength/Tone: [facility] for tasks assessed Left Lower Extremity Assessment LLE ROM/Strength/Tone: [facility] for tasks assessed Trunk Assessment Trunk Assessment: Normal   Balance    End of Session PT - End of Session Equipment Utilized During Treatment: Gait belt;Cervical collar Activity Tolerance: Patient  tolerated treatment well Patient left: in bed;with call bell/phone within  reach (with OT) Nurse Communication: Mobility status  GP     Bowyn Mercier 08/26/2011, 9:39 AM

## 2011-08-26 NOTE — Progress Notes (Signed)
Patient ID: Carla Lamb, female   DOB: Nov 02, 1955, 56 y.o.   MRN: [id number] C/o pain between shoulders. No weakness. Some drainage.

## 2011-08-26 NOTE — Clinical Social Work Note (Signed)
CSW received consult for SNF. Pt was discussed in progression rounds with RN. PT is not recommending follow up. CSW will alert RNCM. CSW is signing off as no further needs identified. Please reconsult if a need arises prior to discharge.   Dede Query, MSW, Theresia Majors [phone]

## 2011-08-26 NOTE — Evaluation (Signed)
I have read and agree with the below assessment. Roxie Kreeger Helen Whitlow PT, DPT Pager: [phone] 

## 2011-08-26 NOTE — Progress Notes (Signed)
Stable. Still draining. Neuro stable

## 2011-08-26 NOTE — Op Note (Signed)
[name], GLATFELTER NO.:  [id number]  MEDICAL RECORD NO.:  [id number]  LOCATION:  [id number]                        FACILITY:  [facility]  PHYSICIAN:  Hilda Lias, M.D.   DATE OF BIRTH:  12-Jun-1955  DATE OF PROCEDURE:  08/25/2011 DATE OF DISCHARGE:                              OPERATIVE REPORT   PREOPERATIVE DIAGNOSIS:  Severe degenerative disk disease at L4-5, 5-6, 6-7 with spondylolisthesis.  Chronic radiculopathy left worse than right one,lack of lordosis.  Chronic neck pain.  POSTOPERATIVE DIAGNOSIS:  Severe degenerative disk disease at L4-5, 5-6, 6-7 with spondylolisthesis.  Chronic radiculopathy left worse than right one, lack of lordosis.  Chronic neck pain.  PROCEDURE:  Anterior C4-C5 diskectomy decompression of spinal cord, bilateral foraminotomy.  C6 corpectomy fusion from C5-5-C7. Foraminotomy to decompress the C6 and C7 nerve roots.  Cages 7 mm L4-5 and 21 lordotic at the level of C6 plate.  Microscope.  SURGEON:  Hilda Lias, MD.  ASSISTANT:  Reinaldo Meeker, MD  CLINICAL HISTORY:  Ms. Carla Lamb is a lady who had been complaining of neck pain with radiation to both upper extremities.  The left worse than right one.  The patient has severe case of degenerative disk disease with spondylolisthesis at cefrvical4-5 and severe degenerative disk at the level of 5-6, 6-7.  Surgery was advised in view of no improvement.  She and her husband knew the risk of the surgery including possibility of no improvement, need for further surgery, and weakness.  PROCEDURE:  The patient was taken to the OR.  After intubation, the left side of the neck was cleaned with DuraPrep.  Drapes were applied. Transverse incision was made through the skin, subcutaneous tissue, platysma, straight down to the cervical spine.  X-rays showed that we were at L4-5.  The patient had quite a bit of large osteophyte at those 2 levels.  We started at [facility] and indeed there was  almost no space.  We had to drill___the_______ forearm away posteriorly to get into the posterior ligament.  Decompression of the spinal cord as well as both C5 nerve root was done.  C5 in the left side was swollen and quite a bit of trapped in the scar tissue.  The lysis was accomplished.  When we started doing dissection at the level of 5-6, 6-7 we found that the bone was cystic, was quite osteopenic.  Because of that, we decided to proceed with corpectomy.  A corpectomy was done using the drill with total removal of the C6 vertebral body leaving the lateral aspect.  Then with the microscope, we removed the posterior ligament as well as the yellow ligament, decompressing the C6 and C7 nerve root with plenty of space.  Then, a cage of 21 with autograft morselized bone was inserted. At the level of L4-5, the cage was 7 mm.  Lateral x-ray showed good position of the cages and better alignment of the spine.  Then a plate using 6 screws towards the level of C4, two at the level of C5, and two at the level of C7 was done.  Another x-ray showed good position of the cages as well as well as the plate.  We waited 10 minutes just to be sure that we have a good hemostasis.  Nevertheless, this lady had been in the past taking Mobic and we decided to leave drain in the precervical area.  From then on, the area was irrigated.  The wound was closed with Vicryl and Steri-Strips.          ______________________________ Hilda Lias, M.D.     EB/MEDQ  D:  08/25/2011  T:  08/26/2011  Job:  [id number]

## 2011-08-27 NOTE — Discharge Summary (Signed)
Physician Discharge Summary  Patient ID: Carla Lamb MRN: [id number] DOB/AGE: 04-25-1955 56 y.o.  Admit date: 08/25/2011 Discharge date: 08/27/2011  Admission Diagnoses:cervical stenosis c4 to c7  Discharge Diagnoses: same   Discharged Condition:no pain ,no weakness. Still some drainage  Hospital Course: surgery Consults: none  Significant Diagnostic Studies: mri  Treatments: surgery  Discharge Exam: Blood pressure 123/78, pulse 62, temperature 97.8 F (36.6 C), temperature source Oral, resp. rate 18, height 5\' 6"  (1.676 m), weight 61.236 kg (135 lb), SpO2 99.00%. No weakness. Some drainage  Disposition: home with the drain. She and her husband know how to empty. To be seen in my office next week. Home on percocet, diazepam,stterapred  Medication List  As of 08/27/2011  8:53 AM   ASK your doctor about these medications         folic acid 1 MG tablet   Commonly known as: FOLVITE   Take 2 mg by mouth daily.      meloxicam 7.5 MG tablet   Commonly known as: MOBIC   Take 7.5 mg by mouth daily.      methotrexate 2.5 MG tablet   Commonly known as: RHEUMATREX   Take 17.5 mg by mouth once a week. TUESDAYS.  Caution:Chemotherapy. Protect from light.  Takes 7 tablets once weekly      sertraline 25 MG tablet   Commonly known as: ZOLOFT   Take 12.5 mg by mouth daily.      Vitamin D 2000 UNITS Caps   Take 1 capsule by mouth daily.             Signed: Karn Cassis 08/27/2011, 8:53 AM

## 2011-08-28 ENCOUNTER — Encounter (HOSPITAL_COMMUNITY): Payer: Self-pay | Admitting: Neurosurgery

## 2011-09-04 ENCOUNTER — Other Ambulatory Visit (HOSPITAL_COMMUNITY): Payer: BC Managed Care – PPO

## 2013-07-25 DIAGNOSIS — C50911 Malignant neoplasm of unspecified site of right female breast: Secondary | ICD-10-CM | POA: Insufficient documentation

## 2015-01-20 DIAGNOSIS — M810 Age-related osteoporosis without current pathological fracture: Secondary | ICD-10-CM

## 2015-01-20 HISTORY — DX: Age-related osteoporosis without current pathological fracture: M81.0

## 2015-01-21 ENCOUNTER — Ambulatory Visit (INDEPENDENT_AMBULATORY_CARE_PROVIDER_SITE_OTHER): Payer: Commercial Managed Care - HMO | Admitting: Gynecology

## 2015-01-21 ENCOUNTER — Encounter: Payer: Self-pay | Admitting: Gynecology

## 2015-01-21 ENCOUNTER — Other Ambulatory Visit (HOSPITAL_COMMUNITY)
Admission: RE | Admit: 2015-01-21 | Discharge: 2015-01-21 | Disposition: A | Discharge status: Home / Self Care | Payer: Commercial Managed Care - HMO | Source: Ambulatory Visit | Attending: Gynecology | Admitting: Gynecology

## 2015-01-21 VITALS — BP 124/76 | Ht 65.5 in | Wt 134.6 lb

## 2015-01-21 DIAGNOSIS — Z1329 Encounter for screening for other suspected endocrine disorder: Secondary | ICD-10-CM

## 2015-01-21 DIAGNOSIS — C50911 Malignant neoplasm of unspecified site of right female breast: Secondary | ICD-10-CM

## 2015-01-21 DIAGNOSIS — Z13220 Encounter for screening for lipoid disorders: Secondary | ICD-10-CM | POA: Diagnosis not present

## 2015-01-21 DIAGNOSIS — Z01419 Encounter for gynecological examination (general) (routine) without abnormal findings: Secondary | ICD-10-CM | POA: Insufficient documentation

## 2015-01-21 DIAGNOSIS — N952 Postmenopausal atrophic vaginitis: Secondary | ICD-10-CM | POA: Diagnosis not present

## 2015-01-21 DIAGNOSIS — M8580 Other specified disorders of bone density and structure, unspecified site: Secondary | ICD-10-CM | POA: Diagnosis not present

## 2015-01-21 DIAGNOSIS — Z1151 Encounter for screening for human papillomavirus (HPV): Secondary | ICD-10-CM | POA: Insufficient documentation

## 2015-01-21 DIAGNOSIS — D251 Intramural leiomyoma of uterus: Secondary | ICD-10-CM | POA: Diagnosis not present

## 2015-01-21 NOTE — Patient Instructions (Signed)
Follow up for bone density as scheduled.  Consider Stop Smoking.  Help is available at [facility]'s smoking cessation program @ [email or web address] or 336-832-0838. OR 1-800-QUIT-NOW (1-800-784-8669) for free smoking cessation counseling.  Smokefree.gov (http://www.smokefree.gov) provides free, accurate, evidence-based information and professional assistance to help support the immediate and long-term needs of people trying to quit smoking.    Smoking Hazards Smoking cigarettes is extremely bad for your health. Tobacco smoke has over 200 known poisons in it. There are over 60 chemicals in tobacco smoke that cause cancer. Some of the chemicals found in cigarette smoke include:  Cyanide.  Benzene.  Formaldehyde.  Methanol (wood alcohol).  Acetylene (fuel used in welding torches).  Ammonia.  Cigarette smoke also contains the poisonous gases nitrogen oxide and carbon monoxide.  Cigarette smokers have an increased risk of many serious medical problems, including: Lung cancer.  Lung disease (such as pneumonia, bronchitis, and emphysema).  Heart attack and chest pain due to the heart not getting enough oxygen (angina).  Heart disease and peripheral blood vessel disease.  Hypertension.  Stroke.  Oral cancer (cancer of the lip, mouth, or voice box).  Bladder cancer.  Pancreatic cancer.  Cervical cancer.  Pregnancy complications, including premature birth.  Low birthweight babies.  Early menopause.  Lower estrogen level for women.  Infertility.  Facial wrinkles.  Blindness.  Increased risk of broken bones (fractures).  Senile dementia.  Stillbirths and smaller newborn babies, birth defects, and genetic damage to sperm.  Stomach ulcers and internal bleeding.  Children of smokers have an increased risk of the following, because of secondhand smoke exposure:  Sudden infant death syndrome (SIDS).  Respiratory infections.  Lung cancer.  Heart disease.  Ear infections.  Smoking  causes approximately: 90% of all lung cancer deaths in men.  80% of all lung cancer deaths in women.  90% of deaths from chronic obstructive lung disease.  Compared with nonsmokers, smoking increases the risk of: Coronary heart disease by 2 to 4 times.  Stroke by 2 to 4 times.  Men developing lung cancer by 23 times.  Women developing lung cancer by 13 times.  Dying from chronic obstructive lung diseases by 12 times.  Someone who smokes 2 packs a day loses about 8 years of his or her life. Even smoking lightly shortens your life expectancy by several years. You can greatly reduce the risk of medical problems for you and your family by stopping now. Smoking is the most preventable cause of death and disease in our society. Within days of quitting smoking, your circulation returns to normal, you decrease the risk of having a heart attack, and your lung capacity improves. There may be some increased phlegm in the first few days after quitting, and it may take months for your lungs to clear up completely. Quitting for 10 years cuts your lung cancer risk to almost that of a nonsmoker. WHY IS SMOKING ADDICTIVE? Nicotine is the chemical agent in tobacco that is capable of causing addiction or dependence.  When you smoke and inhale, nicotine is absorbed rapidly into the bloodstream through your lungs. Nicotine absorbed through the lungs is capable of creating a powerful addiction. Both inhaled and non-inhaled nicotine may be addictive.  Addiction studies of cigarettes and spit tobacco show that addiction to nicotine occurs mainly during the teen years, when young people begin using tobacco products.  WHAT ARE THE BENEFITS OF QUITTING?  There are many health benefits to quitting smoking.  Likelihood of developing cancer and heart disease   decreases. Health improvements are seen almost immediately.  Blood pressure, pulse rate, and breathing patterns start returning to normal soon after quitting.  People who  quit may see an improvement in their overall quality of life.  Some people choose to quit all at once. Other options include nicotine replacement products, such as patches, gum, and nasal sprays. Do not use these products without first checking with your caregiver. QUITTING SMOKING It is not easy to quit smoking. Nicotine is addicting, and longtime habits are hard to change. To start, you can write down all your reasons for quitting, tell your family and friends you want to quit, and ask for their help. Throw your cigarettes away, chew gum or cinnamon sticks, keep your hands busy, and drink extra water or juice. Go for walks and practice deep breathing to relax. Think of all the money you are saving: around $1,000 a year, for the average pack-a-day smoker. Nicotine patches and gum have been shown to improve success at efforts to stop smoking. Zyban (bupropion) is an anti-depressant drug that can be prescribed to reduce nicotine withdrawal symptoms and to suppress the urge to smoke. Smoking is an addiction with both physical and psychological effects. Joining a stop-smoking support group can help you cope with the emotional issues. For more information and advice on programs to stop smoking, call your doctor, your local hospital, or these organizations: American Lung Association - 1-800-LUNGUSA   Smoking Cessation  This document explains the best ways for you to quit smoking and new treatments to help. It lists new medicines that can double or triple your chances of quitting and quitting for good. It also considers ways to avoid relapses and concerns you may have about quitting, including weight gain. NICOTINE: A POWERFUL ADDICTION If you have tried to quit smoking, you know how hard it can be. It is hard because nicotine is a very addictive drug. For some people, it can be as addictive as heroin or cocaine. Usually, people make 2 or 3 tries, or more, before finally being able to quit. Each time you try to  quit, you can learn about what helps and what hurts. Quitting takes hard work and a lot of effort, but you can quit smoking. QUITTING SMOKING IS ONE OF THE MOST IMPORTANT THINGS YOU WILL EVER DO.  You will live longer, feel better, and live better.   The impact on your body of quitting smoking is felt almost immediately:   Within 20 minutes, blood pressure decreases. Pulse returns to its normal level.   After 8 hours, carbon monoxide levels in the blood return to normal. Oxygen level increases.   After 24 hours, chance of heart attack starts to decrease. Breath, hair, and body stop smelling like smoke.   After 48 hours, damaged nerve endings begin to recover. Sense of taste and smell improve.   After 72 hours, the body is virtually free of nicotine. Bronchial tubes relax and breathing becomes easier.   After 2 to 12 weeks, lungs can hold more air. Exercise becomes easier and circulation improves.   Quitting will reduce your risk of having a heart attack, stroke, cancer, or lung disease:   After 1 year, the risk of coronary heart disease is cut in half.   After 5 years, the risk of stroke falls to the same as a nonsmoker.   After 10 years, the risk of lung cancer is cut in half and the risk of other cancers decreases significantly.   After 15 years,   the risk of coronary heart disease drops, usually to the level of a nonsmoker.   If you are pregnant, quitting smoking will improve your chances of having a healthy baby.   The people you live with, especially your children, will be healthier.   You will have extra money to spend on things other than cigarettes.  FIVE KEYS TO QUITTING Studies have shown that these 5 steps will help you quit smoking and quit for good. You have the best chances of quitting if you use them together:  Get ready.   Get support and encouragement.   Learn new skills and behaviors.   Get medicine to reduce your nicotine addiction and use it correctly.    Be prepared for relapse or difficult situations. Be determined to continue trying to quit, even if you do not succeed at first.  1. GET READY  Set a quit date.   Change your environment.   Get rid of ALL cigarettes, ashtrays, matches, and lighters in your home, car, and place of work.   Do not let people smoke in your home.   Review your past attempts to quit. Think about what worked and what did not.   Once you quit, do not smoke. NOT EVEN A PUFF!  2. GET SUPPORT AND ENCOURAGEMENT Studies have shown that you have a better chance of being successful if you have help. You can get support in many ways.  Tell your family, friends, and coworkers that you are going to quit and need their support. Ask them not to smoke around you.   Talk to your caregivers (doctor, dentist, nurse, pharmacist, psychologist, and/or smoking counselor).   Get individual, group, or telephone counseling and support. The more counseling you have, the better your chances are of quitting. Programs are available at local hospitals and health centers. Call your local health department for information about programs in your area.   Spiritual beliefs and practices may help some smokers quit.   Quit meters are small computer programs online or downloadable that keep track of quit statistics, such as amount of "quit-time," cigarettes not smoked, and money saved.   Many smokers find one or more of the many self-help books available useful in helping them quit and stay off tobacco.  3. LEARN NEW SKILLS AND BEHAVIORS  Try to distract yourself from urges to smoke. Talk to someone, go for a walk, or occupy your time with a task.   When you first try to quit, change your routine. Take a different route to work. Drink tea instead of coffee. Eat breakfast in a different place.   Do something to reduce your stress. Take a hot bath, exercise, or read a book.   Plan something enjoyable to do every day. Reward yourself for  not smoking.   Explore interactive web-based programs that specialize in helping you quit.  4. GET MEDICINE AND USE IT CORRECTLY Medicines can help you stop smoking and decrease the urge to smoke. Combining medicine with the above behavioral methods and support can quadruple your chances of successfully quitting smoking. The [place] Food and Drug Administration (FDA) has approved 7 medicines to help you quit smoking. These medicines fall into 3 categories.  Nicotine replacement therapy (delivers nicotine to your body without the negative effects and risks of smoking):   Nicotine gum: Available over-the-counter.   Nicotine lozenges: Available over-the-counter.   Nicotine inhaler: Available by prescription.   Nicotine nasal spray: Available by prescription.   Nicotine skin patches (transdermal): Available   by prescription and over-the-counter.   Antidepressant medicine (helps people abstain from smoking, but how this works is unknown):   Bupropion sustained-release (SR) tablets: Available by prescription.   Nicotinic receptor partial agonist (simulates the effect of nicotine in your brain):   Varenicline tartrate tablets: Available by prescription.   Ask your caregiver for advice about which medicines to use and how to use them. Carefully read the information on the package.   Everyone who is trying to quit may benefit from using a medicine. If you are pregnant or trying to become pregnant, nursing an infant, you are under age 18, or you smoke fewer than 10 cigarettes per day, talk to your caregiver before taking any nicotine replacement medicines.   You should stop using a nicotine replacement product and call your caregiver if you experience nausea, dizziness, weakness, vomiting, fast or irregular heartbeat, mouth problems with the lozenge or gum, or redness or swelling of the skin around the patch that does not go away.   Do not use any other product containing nicotine while using a  nicotine replacement product.   Talk to your caregiver before using these products if you have diabetes, heart disease, asthma, stomach ulcers, you had a recent heart attack, you have high blood pressure that is not controlled with medicine, a history of irregular heartbeat, or you have been prescribed medicine to help you quit smoking.  5. BE PREPARED FOR RELAPSE OR DIFFICULT SITUATIONS  Most relapses occur within the first 3 months after quitting. Do not be discouraged if you start smoking again. Remember, most people try several times before they finally quit.   You may have symptoms of withdrawal because your body is used to nicotine. You may crave cigarettes, be irritable, feel very hungry, cough often, get headaches, or have difficulty concentrating.   The withdrawal symptoms are only temporary. They are strongest when you first quit, but they will go away within 10 to 14 days.  Here are some difficult situations to watch for:  Alcohol. Avoid drinking alcohol. Drinking lowers your chances of successfully quitting.   Caffeine. Try to reduce the amount of caffeine you consume. It also lowers your chances of successfully quitting.   Other smokers. Being around smoking can make you want to smoke. Avoid smokers.   Weight gain. Many smokers will gain weight when they quit, usually less than 10 pounds. Eat a healthy diet and stay active. Do not let weight gain distract you from your main goal, quitting smoking. Some medicines that help you quit smoking may also help delay weight gain. You can always lose the weight gained after you quit.   Bad mood or depression. There are a lot of ways to improve your mood other than smoking.  If you are having problems with any of these situations, talk to your caregiver. SPECIAL SITUATIONS AND CONDITIONS Studies suggest that everyone can quit smoking. Your situation or condition can give you a special reason to quit.  Pregnant women/new mothers: By  quitting, you protect your baby's health and your own.   Hospitalized patients: By quitting, you reduce health problems and help healing.   Heart attack patients: By quitting, you reduce your risk of a second heart attack.   Lung, head, and neck cancer patients: By quitting, you reduce your chance of a second cancer.   Parents of children and adolescents: By quitting, you protect your children from illnesses caused by secondhand smoke.  QUESTIONS TO THINK ABOUT Think about the following questions   before you try to stop smoking. You may want to talk about your answers with your caregiver.  Why do you want to quit?   If you tried to quit in the past, what helped and what did not?   What will be the most difficult situations for you after you quit? How will you plan to handle them?   Who can help you through the tough times? Your family? Friends? Caregiver?   What pleasures do you get from smoking? What ways can you still get pleasure if you quit?  Here are some questions to ask your caregiver:  How can you help me to be successful at quitting?   What medicine do you think would be best for me and how should I take it?   What should I do if I need more help?   What is smoking withdrawal like? How can I get information on withdrawal?  Quitting takes hard work and a lot of effort, but you can quit smoking.  You may obtain a copy of any labs that were done today by logging onto MyChart as outlined in the instructions provided with your AVS (after visit summary). The office will not call with normal lab results but certainly if there are any significant abnormalities then we will contact you.   Health Maintenance Adopting a healthy lifestyle and getting preventive care can go a long way to promote health and wellness. Talk with your health care provider about what schedule of regular examinations is right for you. This is a good chance for you to check in with your provider about disease  prevention and staying healthy. In between checkups, there are plenty of things you can do on your own. Experts have done a lot of research about which lifestyle changes and preventive measures are most likely to keep you healthy. Ask your health care provider for more information. WEIGHT AND DIET  Eat a healthy diet  Be sure to include plenty of vegetables, fruits, low-fat dairy products, and lean protein.  Do not eat a lot of foods high in solid fats, added sugars, or salt.  Get regular exercise. This is one of the most important things you can do for your health.  Most adults should exercise for at least 150 minutes each week. The exercise should increase your heart rate and make you sweat (moderate-intensity exercise).  Most adults should also do strengthening exercises at least twice a week. This is in addition to the moderate-intensity exercise.  Maintain a healthy weight  Body mass index (BMI) is a measurement that can be used to identify possible weight problems. It estimates body fat based on height and weight. Your health care provider can help determine your BMI and help you achieve or maintain a healthy weight.  For females 20 years of age and older:   A BMI below 18.5 is considered underweight.  A BMI of 18.5 to 24.9 is normal.  A BMI of 25 to 29.9 is considered overweight.  A BMI of 30 and above is considered obese.  Watch levels of cholesterol and blood lipids  You should start having your blood tested for lipids and cholesterol at 62 years of age, then have this test every 5 years.  You may need to have your cholesterol levels checked more often if:  Your lipid or cholesterol levels are high.  You are older than 62 years of age.  You are at high risk for heart disease.  CANCER SCREENING   Lung   Cancer  Lung cancer screening is recommended for adults 55-80 years old who are at high risk for lung cancer because of a history of smoking.  A yearly low-dose  CT scan of the lungs is recommended for people who:  Currently smoke.  Have quit within the past 15 years.  Have at least a 30-pack-year history of smoking. A pack year is smoking an average of one pack of cigarettes a day for 1 year.  Yearly screening should continue until it has been 15 years since you quit.  Yearly screening should stop if you develop a health problem that would prevent you from having lung cancer treatment.  Breast Cancer  Practice breast self-awareness. This means understanding how your breasts normally appear and feel.  It also means doing regular breast self-exams. Let your health care provider know about any changes, no matter how small.  If you are in your 20s or 30s, you should have a clinical breast exam (CBE) by a health care provider every 1-3 years as part of a regular health exam.  If you are 40 or older, have a CBE every year. Also consider having a breast X-ray (mammogram) every year.  If you have a family history of breast cancer, talk to your health care provider about genetic screening.  If you are at high risk for breast cancer, talk to your health care provider about having an MRI and a mammogram every year.  Breast cancer gene (BRCA) assessment is recommended for women who have family members with BRCA-related cancers. BRCA-related cancers include:  Breast.  Ovarian.  Tubal.  Peritoneal cancers.  Results of the assessment will determine the need for genetic counseling and BRCA1 and BRCA2 testing. Cervical Cancer Routine pelvic examinations to screen for cervical cancer are no longer recommended for nonpregnant women who are considered low risk for cancer of the pelvic organs (ovaries, uterus, and vagina) and who do not have symptoms. A pelvic examination may be necessary if you have symptoms including those associated with pelvic infections. Ask your health care provider if a screening pelvic exam is right for you.   The Pap test is the  screening test for cervical cancer for women who are considered at risk.  If you had a hysterectomy for a problem that was not cancer or a condition that could lead to cancer, then you no longer need Pap tests.  If you are older than 65 years, and you have had normal Pap tests for the past 10 years, you no longer need to have Pap tests.  If you have had past treatment for cervical cancer or a condition that could lead to cancer, you need Pap tests and screening for cancer for at least 20 years after your treatment.  If you no longer get a Pap test, assess your risk factors if they change (such as having a new sexual partner). This can affect whether you should start being screened again.  Some women have medical problems that increase their chance of getting cervical cancer. If this is the case for you, your health care provider may recommend more frequent screening and Pap tests.  The human papillomavirus (HPV) test is another test that may be used for cervical cancer screening. The HPV test looks for the virus that can cause cell changes in the cervix. The cells collected during the Pap test can be tested for HPV.  The HPV test can be used to screen women 30 years of age and older. Getting tested for   HPV can extend the interval between normal Pap tests from three to five years.  An HPV test also should be used to screen women of any age who have unclear Pap test results.  After 62 years of age, women should have HPV testing as often as Pap tests.  Colorectal Cancer  This type of cancer can be detected and often prevented.  Routine colorectal cancer screening usually begins at 62 years of age and continues through 62 years of age.  Your health care provider may recommend screening at an earlier age if you have risk factors for colon cancer.  Your health care provider may also recommend using home test kits to check for hidden blood in the stool.  A small camera at the end of a tube can  be used to examine your colon directly (sigmoidoscopy or colonoscopy). This is done to check for the earliest forms of colorectal cancer.  Routine screening usually begins at age 50.  Direct examination of the colon should be repeated every 5-10 years through 62 years of age. However, you may need to be screened more often if early forms of precancerous polyps or small growths are found. Skin Cancer  Check your skin from head to toe regularly.  Tell your health care provider about any new moles or changes in moles, especially if there is a change in a mole's shape or color.  Also tell your health care provider if you have a mole that is larger than the size of a pencil eraser.  Always use sunscreen. Apply sunscreen liberally and repeatedly throughout the day.  Protect yourself by wearing long sleeves, pants, a wide-brimmed hat, and sunglasses whenever you are outside. HEART DISEASE, DIABETES, AND HIGH BLOOD PRESSURE   Have your blood pressure checked at least every 1-2 years. High blood pressure causes heart disease and increases the risk of stroke.  If you are between 55 years and 79 years old, ask your health care provider if you should take aspirin to prevent strokes.  Have regular diabetes screenings. This involves taking a blood sample to check your fasting blood sugar level.  If you are at a normal weight and have a low risk for diabetes, have this test once every three years after 62 years of age.  If you are overweight and have a high risk for diabetes, consider being tested at a younger age or more often. PREVENTING INFECTION  Hepatitis B  If you have a higher risk for hepatitis B, you should be screened for this virus. You are considered at high risk for hepatitis B if:  You were born in a country where hepatitis B is common. Ask your health care provider which countries are considered high risk.  Your parents were born in a high-risk country, and you have not been  immunized against hepatitis B (hepatitis B vaccine).  You have HIV or AIDS.  You use needles to inject street drugs.  You live with someone who has hepatitis B.  You have had sex with someone who has hepatitis B.  You get hemodialysis treatment.  You take certain medicines for conditions, including cancer, organ transplantation, and autoimmune conditions. Hepatitis C  Blood testing is recommended for:  Everyone born from 1945 through 1965.  Anyone with known risk factors for hepatitis C. Sexually transmitted infections (STIs)  You should be screened for sexually transmitted infections (STIs) including gonorrhea and chlamydia if:  You are sexually active and are younger than 62 years of age.  You   are older than 62 years of age and your health care provider tells you that you are at risk for this type of infection.  Your sexual activity has changed since you were last screened and you are at an increased risk for chlamydia or gonorrhea. Ask your health care provider if you are at risk.  If you do not have HIV, but are at risk, it may be recommended that you take a prescription medicine daily to prevent HIV infection. This is called pre-exposure prophylaxis (PrEP). You are considered at risk if:  You are sexually active and do not regularly use condoms or know the HIV status of your partner(s).  You take drugs by injection.  You are sexually active with a partner who has HIV. Talk with your health care provider about whether you are at high risk of being infected with HIV. If you choose to begin PrEP, you should first be tested for HIV. You should then be tested every 3 months for as long as you are taking PrEP.  PREGNANCY   If you are premenopausal and you may become pregnant, ask your health care provider about preconception counseling.  If you may become pregnant, take 400 to 800 micrograms (mcg) of folic acid every day.  If you want to prevent pregnancy, talk to your  health care provider about birth control (contraception). OSTEOPOROSIS AND MENOPAUSE   Osteoporosis is a disease in which the bones lose minerals and strength with aging. This can result in serious bone fractures. Your risk for osteoporosis can be identified using a bone density scan.  If you are 65 years of age or older, or if you are at risk for osteoporosis and fractures, ask your health care provider if you should be screened.  Ask your health care provider whether you should take a calcium or vitamin D supplement to lower your risk for osteoporosis.  Menopause may have certain physical symptoms and risks.  Hormone replacement therapy may reduce some of these symptoms and risks. Talk to your health care provider about whether hormone replacement therapy is right for you.  HOME CARE INSTRUCTIONS   Schedule regular health, dental, and eye exams.  Stay current with your immunizations.   Do not use any tobacco products including cigarettes, chewing tobacco, or electronic cigarettes.  If you are pregnant, do not drink alcohol.  If you are breastfeeding, limit how much and how often you drink alcohol.  Limit alcohol intake to no more than 1 drink per day for nonpregnant women. One drink equals 12 ounces of beer, 5 ounces of wine, or 1 ounces of hard liquor.  Do not use street drugs.  Do not share needles.  Ask your health care provider for help if you need support or information about quitting drugs.  Tell your health care provider if you often feel depressed.  Tell your health care provider if you have ever been abused or do not feel safe at home. Document Released: 07/21/2010 Document Revised: 05/22/2013 Document Reviewed: 12/07/2012 ExitCare Patient Information 2015 [employer]. This information is not intended to replace advice given to you by your health care provider. Make sure you discuss any questions you have with your health care provider.  

## 2015-01-21 NOTE — Progress Notes (Signed)
Carla Lamb May 11, 1955 [id number]        60 y.o.  [id number]  for annual exam.  Has not been in since 2010. Without complaints.  Several issues noted below.  Past medical history,surgical history, problem list, medications, allergies, family history and social history were all reviewed and documented as reviewed in the EPIC chart.  ROS:  Performed with pertinent positives and negatives included in the history, assessment and plan.   Additional significant findings :  none   Exam: Programmer, multimedia Vitals:   01/21/15 0854  BP: 124/76  Height: 5' 5.5" (1.664 m)  Weight: 134 lb 9.6 oz (61.054 kg)   General appearance:  Normal affect, orientation and appearance. Skin: Grossly normal HEENT: Without gross lesions.  No cervical or supraclavicular adenopathy. Thyroid normal.  Lungs:  Clear without wheezing, rales or rhonchi Cardiac: RR, without RMG Abdominal:  Soft, nontender, without masses, guarding, rebound, organomegaly or hernia Breasts:  Examined lying and sitting. Left with implant. Without masses, retractions, discharge or axillary adenopathy.  Right status post mastectomy with reconstruction no masses, axillary adenopathy Pelvic:  Ext/BUS/vagina with atrophic changes  Cervix with atrophic changes. Pap/HPV  Uterus axial to anteverted, normal size, shape and contour, midline and mobile nontender   Adnexa  Without masses or tenderness    Anus and perineum  Normal   Rectovaginal  Normal sphincter tone without palpated masses or tenderness.    Assessment/Plan:  60 y.o. [id number] female for annual exam.   1. Postmenopausal/mild atrophic changes. Is having some hot flashes and sweats which she is dealing with. No issues with vaginal dryness or any vaginal bleeding. We'll continue to monitor and report any issues or vaginal bleeding. 2. Osteopenia. DEXA 2010 T score -2.1.  Has not had a follow up DEXA. Is being followed for rheumatoid arthritis. Schedule DEXA now and patient will  follow up for this.  History of vitamin D deficiency. Patient is taking extra vitamin D. Check vitamin D level today. 3. Mammography reported July 2016. Continue with annual mammography when due. History of right mastectomy with reconstruction.  Exam today NED. SBE monthly reviewed. 4. History of multiple small myomas on ultrasound. Exam shows uterus grossly normal size. We'll plan annual exam follow up to monitor. 5. Colonoscopy reported 6 years ago. Patients is going to call her gastroenterologist can check when they want to repeat this. 6. Pap smear 2010. Pap/HPV today. She currently is on immunosuppression for her rheumatoid arthritis. Recommend annual cytology and she acknowledges my recommendation.  No history of significant abnormal Pap smears in the past. 7. Stop smoking strategies reviewed and encouraged. 8. Health maintenance. Patient unclear whether she had routine lab work done she does a lot as far as her rheumatoid arthritis. Will check baseline CBC, comprehensive metabolic panel, lipid profile, TSH, vitamin D, urinalysis.  Follow up for DEXA otherwise annual exam in one year.   Anastasio Auerbach MD, 9:20 AM 01/21/2015

## 2015-01-21 NOTE — Addendum Note (Signed)
Addended by: Thurnell Garbe A on: 01/21/2015 09:53 AM   Modules accepted: Orders

## 2015-01-22 LAB — LIPID PANEL
Cholesterol: 243 mg/dL — ABNORMAL HIGH (ref 125–200)
HDL: 113 mg/dL (ref >=46)
LDL Cholesterol: 113 mg/dL (ref <130)
Total CHOL/HDL Ratio: 2.2 Ratio (ref <=5.0)
Triglycerides: 85 mg/dL (ref <150)
VLDL: 17 mg/dL (ref <30)

## 2015-01-22 LAB — TSH: TSH: 1.465 u[IU]/mL (ref 0.350–4.500)

## 2015-01-22 LAB — COMPREHENSIVE METABOLIC PANEL
ALT: 18 U/L (ref 6–29)
AST: 18 U/L (ref 10–35)
Albumin: 4.2 g/dL (ref 3.6–5.1)
Alkaline Phosphatase: 78 U/L (ref 33–130)
BUN: 17 mg/dL (ref 7–25)
CO2: 28 mmol/L (ref 20–31)
Calcium: 9.4 mg/dL (ref 8.6–10.4)
Chloride: 104 mmol/L (ref 98–110)
Creat: 0.81 mg/dL (ref 0.50–0.99)
Glucose, Bld: 86 mg/dL (ref 65–99)
Potassium: 4.8 mmol/L (ref 3.5–5.3)
Sodium: 139 mmol/L (ref 135–146)
Total Bilirubin: 0.4 mg/dL (ref 0.2–1.2)
Total Protein: 6.7 g/dL (ref 6.1–8.1)

## 2015-01-22 LAB — CBC WITH DIFFERENTIAL/PLATELET
Basophils Absolute: 0.1 10*3/uL (ref 0.0–0.1)
Basophils Relative: 1 % (ref 0–1)
Eosinophils Absolute: 0.2 10*3/uL (ref 0.0–0.7)
Eosinophils Relative: 3 % (ref 0–5)
HCT: 42 % (ref 36.0–46.0)
Hemoglobin: 14.3 g/dL (ref 12.0–15.0)
Lymphocytes Relative: 43 % (ref 12–46)
Lymphs Abs: 2.5 10*3/uL (ref 0.7–4.0)
MCH: 32.7 pg (ref 26.0–34.0)
MCHC: 34 g/dL (ref 30.0–36.0)
MCV: 96.1 fL (ref 78.0–100.0)
MPV: 10.1 fL (ref 8.6–12.4)
Monocytes Absolute: 0.6 10*3/uL (ref 0.1–1.0)
Monocytes Relative: 10 % (ref 3–12)
Neutro Abs: 2.5 10*3/uL (ref 1.7–7.7)
Neutrophils Relative %: 43 % (ref 43–77)
Platelets: 316 10*3/uL (ref 150–400)
RBC: 4.37 MIL/uL (ref 3.87–5.11)
RDW: 13.9 % (ref 11.5–15.5)
WBC: 5.7 10*3/uL (ref 4.0–10.5)

## 2015-01-23 LAB — URINALYSIS W MICROSCOPIC + REFLEX CULTURE
Bacteria, UA: NONE SEEN [HPF]
Bilirubin Urine: NEGATIVE
Casts: NONE SEEN [LPF]
Crystals: NONE SEEN [HPF]
Glucose, UA: NEGATIVE
Hgb urine dipstick: NEGATIVE
Ketones, ur: NEGATIVE
Leukocytes, UA: NEGATIVE
Nitrite: NEGATIVE
Protein, ur: NEGATIVE
RBC / HPF: NONE SEEN RBC/HPF (ref <=2)
Specific Gravity, Urine: 1.019 (ref 1.001–1.035)
Squamous Epithelial / LPF: NONE SEEN [HPF] (ref <=5)
WBC, UA: NONE SEEN WBC/HPF (ref <=5)
Yeast: NONE SEEN [HPF]
pH: 5.5 (ref 5.0–8.0)

## 2015-01-23 LAB — CYTOLOGY - PAP

## 2015-01-23 LAB — VITAMIN D 25 HYDROXY (VIT D DEFICIENCY, FRACTURES): Vit D, 25-Hydroxy: 45 ng/mL (ref 30–100)

## 2015-02-07 ENCOUNTER — Telehealth: Payer: Self-pay | Admitting: Gynecology

## 2015-02-07 ENCOUNTER — Encounter: Payer: Self-pay | Admitting: Gynecology

## 2015-02-07 ENCOUNTER — Other Ambulatory Visit: Payer: Self-pay | Admitting: Gynecology

## 2015-02-07 ENCOUNTER — Ambulatory Visit (INDEPENDENT_AMBULATORY_CARE_PROVIDER_SITE_OTHER): Payer: Commercial Managed Care - HMO

## 2015-02-07 DIAGNOSIS — Z13820 Encounter for screening for osteoporosis: Secondary | ICD-10-CM

## 2015-02-07 DIAGNOSIS — M8580 Other specified disorders of bone density and structure, unspecified site: Secondary | ICD-10-CM

## 2015-02-07 DIAGNOSIS — M810 Age-related osteoporosis without current pathological fracture: Secondary | ICD-10-CM

## 2015-02-07 NOTE — Telephone Encounter (Signed)
Pt aware, transferred to appointment desk

## 2015-02-07 NOTE — Telephone Encounter (Signed)
Tell patient that her DEXA scan shows osteoporosis. Recommend office visit to discuss treatment options

## 2015-02-13 ENCOUNTER — Ambulatory Visit (INDEPENDENT_AMBULATORY_CARE_PROVIDER_SITE_OTHER): Payer: Commercial Managed Care - HMO | Admitting: Gynecology

## 2015-02-13 ENCOUNTER — Encounter: Payer: Self-pay | Admitting: Gynecology

## 2015-02-13 VITALS — BP 118/76

## 2015-02-13 DIAGNOSIS — M810 Age-related osteoporosis without current pathological fracture: Secondary | ICD-10-CM | POA: Diagnosis not present

## 2015-02-13 MED ORDER — ALENDRONATE SODIUM 70 MG PO TABS: 70.0000 mg | ORAL_TABLET | ORAL | Status: DC

## 2015-02-13 NOTE — Patient Instructions (Signed)
Start on the Fosamax one pill weekly.  Call me if you have any issues or questions.  Alendronate weekly tablets What is this medicine? ALENDRONATE (a LEN droe nate) slows calcium loss from bones. It helps to make healthy bone and to slow bone loss in people with osteoporosis. It may be used to treat Paget's disease. This medicine may be used for other purposes; ask your health care provider or pharmacist if you have questions. What should I tell my health care provider before I take this medicine? They need to know if you have any of these conditions: -esophagus, stomach, or intestine problems, like acid-reflux or GERD -dental disease -kidney disease -low blood calcium -low vitamin D -problems swallowing -problems sitting or standing for 30 minutes -an unusual or allergic reaction to alendronate, other medicines, foods, dyes, or preservatives -pregnant or trying to get pregnant -breast-feeding How should I use this medicine? You must take this medicine exactly as directed or you will lower the amount of medicine you absorb into your body or you may cause yourself harm. Take your dose by mouth first thing in the morning, after you are up for the day. Do not eat or drink anything before you take this medicine. Swallow your medicine with a full glass (6 to 8 fluid ounces) of plain water. Do not take this tablet with any other drink. Do not chew or crush the tablet. After taking this medicine, do not eat breakfast, drink, or take any medicines or vitamins for at least 30 minutes. Stand or sit up for at least 30 minutes after you take this medicine; do not lie down. Take this medicine on the same day every week. Do not take your medicine more often than directed. Talk to your pediatrician regarding the use of this medicine in children. Special care may be needed. Overdosage: If you think you have taken too much of this medicine contact a poison control center or emergency room at once. NOTE: This  medicine is only for you. Do not share this medicine with others. What if I miss a dose? If you miss a dose, take the dose on the morning after you remember. Then take your next dose on your regular day of the week. Never take 2 tablets on the same day. Do not take double or extra doses. What may interact with this medicine? -aluminum hydroxide -antacids -aspirin -calcium supplements -drugs for inflammation like ibuprofen, naproxen, and others -iron supplements -magnesium supplements -vitamins with minerals This list may not describe all possible interactions. Give your health care provider a list of all the medicines, herbs, non-prescription drugs, or dietary supplements you use. Also tell them if you smoke, drink alcohol, or use illegal drugs. Some items may interact with your medicine. What should I watch for while using this medicine? Visit your doctor or health care professional for regular checks ups. It may be some time before you see benefit from this medicine. Do not stop taking your medication except on your doctor's advice. Your doctor or health care professional may order blood tests and other tests to see how you are doing. You should make sure you get enough calcium and vitamin D while you are taking this medicine, unless your doctor tells you not to. Discuss the foods you eat and the vitamins you take with your health care professional. Some people who take this medicine have severe bone, joint, and/or muscle pain. This medicine may also increase your risk for a broken thigh bone. Tell your doctor  right away if you have pain in your upper leg or groin. Tell your doctor if you have any pain that does not go away or that gets worse. This medicine can make you more sensitive to the sun. If you get a rash while taking this medicine, sunlight may cause the rash to get worse. Keep out of the sun. If you cannot avoid being in the sun, wear protective clothing and use sunscreen. Do not use  sun lamps or tanning beds/booths. What side effects may I notice from receiving this medicine? Side effects that you should report to your doctor or health care professional as soon as possible: -allergic reactions like skin rash, itching or hives, swelling of the face, lips, or tongue -black or tarry stools -bone, muscle or joint pain -changes in vision -chest pain -heartburn or stomach pain -jaw pain, especially after dental work -pain or trouble when swallowing -redness, blistering, peeling or loosening of the skin, including inside the mouth Side effects that usually do not require medical attention (report to your doctor or health care professional if they continue or are bothersome): -changes in taste -diarrhea or constipation -eye pain or itching -headache -nausea or vomiting -stomach gas or fullness This list may not describe all possible side effects. Call your doctor for medical advice about side effects. You may report side effects to FDA at 1-800-FDA-1088. Where should I keep my medicine? Keep out of the reach of children. Store at room temperature of 15 and 30 degrees C (59 and 86 degrees F). Throw away any unused medicine after the expiration date. NOTE: This sheet is a summary. It may not cover all possible information. If you have questions about this medicine, talk to your doctor, pharmacist, or health care provider.    2016, Elsevier/Gold Standard. (2010-11-20 09:02:42)

## 2015-02-13 NOTE — Progress Notes (Signed)
Carla Lamb 1956-01-02 [id number]        60 y.o.  [id number] presents to discuss her most recent bone density that shows osteoporosis with T score -2.5. Compared to a prior DEXA 2010 there was an 8-9% decline at both hips.  Actively being treated for rheumatoid arthritis. Recent vitamin D level was normal.  Past medical history,surgical history, problem list, medications, allergies, family history and social history were all reviewed and documented in the EPIC chart.  Directed ROS with pertinent positives and negatives documented in the history of present illness/assessment and plan.  Exam: Filed Vitals:   02/13/15 1149  BP: 118/76   General appearance:  Normal   Assessment/Plan:  60 y.o. [id number] patient with osteoporosis being treated for rheumatoid arthritis. Currently smokes. I reviewed the need to stop smoking not only from her bone standpoint but also from her cardiovascular health standpoint. The need for daily weightbearing exercise also reviewed. Options for treatment were reviewed to include his phosphates such as Fosamax, Actonel, Boniva and Reclast. Prolia, Evista and Forteo also discussed. Potential benefits, risks and side effects reviewed. After lengthy discussion the patient wants a trial of alendronate 70 mg weekly. Risks of GERD, osteonecrosis of the jaw and atypical fractures discussed. The way to take the medication was reviewed. We'll plan repeat DEXA at 2 years and a tentative five-year treatment course. Patient agrees and will call me if she has any issues once starting the medication.    Anastasio Auerbach MD, 12:24 PM 02/13/2015

## 2016-01-23 ENCOUNTER — Encounter: Payer: Self-pay | Admitting: Gynecology

## 2016-01-23 ENCOUNTER — Ambulatory Visit (INDEPENDENT_AMBULATORY_CARE_PROVIDER_SITE_OTHER): Payer: Commercial Managed Care - HMO | Admitting: Gynecology

## 2016-01-23 VITALS — BP 120/70 | Ht 66.0 in | Wt 135.0 lb

## 2016-01-23 DIAGNOSIS — Z01411 Encounter for gynecological examination (general) (routine) with abnormal findings: Secondary | ICD-10-CM | POA: Diagnosis not present

## 2016-01-23 DIAGNOSIS — Z13220 Encounter for screening for lipoid disorders: Secondary | ICD-10-CM | POA: Diagnosis not present

## 2016-01-23 DIAGNOSIS — N952 Postmenopausal atrophic vaginitis: Secondary | ICD-10-CM

## 2016-01-23 DIAGNOSIS — M810 Age-related osteoporosis without current pathological fracture: Secondary | ICD-10-CM

## 2016-01-23 DIAGNOSIS — C50011 Malignant neoplasm of nipple and areola, right female breast: Secondary | ICD-10-CM | POA: Diagnosis not present

## 2016-01-23 LAB — URINALYSIS W MICROSCOPIC + REFLEX CULTURE
Bacteria, UA: NONE SEEN [HPF]
Bilirubin Urine: NEGATIVE
Casts: NONE SEEN [LPF]
Crystals: NONE SEEN [HPF]
Glucose, UA: NEGATIVE
Ketones, ur: NEGATIVE
Nitrite: NEGATIVE
Protein, ur: NEGATIVE
RBC / HPF: NONE SEEN RBC/HPF (ref <=2)
Specific Gravity, Urine: 1.018 (ref 1.001–1.035)
Yeast: NONE SEEN [HPF]
pH: 6 (ref 5.0–8.0)

## 2016-01-23 LAB — LIPID PANEL
Cholesterol: 231 mg/dL — ABNORMAL HIGH (ref <200)
HDL: 150 mg/dL (ref >50)
LDL Cholesterol: 63 mg/dL (ref <100)
Total CHOL/HDL Ratio: 1.5 Ratio (ref <5.0)
Triglycerides: 92 mg/dL (ref <150)
VLDL: 18 mg/dL (ref <30)

## 2016-01-23 MED ORDER — ALENDRONATE SODIUM 70 MG PO TABS: 70.0000 mg | ORAL_TABLET | ORAL | 11 refills | Status: DC

## 2016-01-23 NOTE — Patient Instructions (Signed)

## 2016-01-23 NOTE — Progress Notes (Addendum)
    Carla Lamb 07/18/1955 [id number]        61 y.o.  [id number] for annual exam.    Past medical history,surgical history, problem list, medications, allergies, family history and social history were all reviewed and documented as reviewed in the EPIC chart.  ROS:  Performed with pertinent positives and negatives included in the history, assessment and plan.   Additional significant findings :  None   Exam: Caryn Bee assistant Vitals:   01/23/16 0951  BP: 120/70  Weight: 135 lb (61.2 kg)  Height: 5\' 6"  (1.676 m)   Body mass index is 21.79 kg/m.  General appearance:  Normal affect, orientation and appearance. Skin: Grossly normal HEENT: Without gross lesions.  No cervical or supraclavicular adenopathy. Thyroid normal.  Lungs:  Clear without wheezing, rales or rhonchi Cardiac: RR, without RMG Abdominal:  Soft, nontender, without masses, guarding, rebound, organomegaly or hernia Breasts:  Examined lying and sitting. Left with implant. Without masses, retractions, discharge or axillary adenopathy. Right status post mastectomy with reconstruction. No masses or axillary adenopathies. Pelvic:  Ext, BUS, Vagina normal  Cervix normal  Uterus anteverted, normal size, shape and contour, midline and mobile nontender   Adnexa without masses or tenderness    Anus and perineum normal   Rectovaginal normal sphincter tone without palpated masses or tenderness.    Assessment/Plan:  61 y.o. [id number] female for annual exam.   1. Postmenopausal/atrophic genital changes. No significant hot flashes, night sweats, vaginal dryness or any vaginal bleeding. Continue to monitor report any issues or bleeding. 2. Osteoporosis. DEXA 01/2015 T score -2.5. Started on alendronate 70 mg weekly last year. Doing well without complaints. Refill 1 year provided. Plan follow up DEXA next year at 2 year interval. Check vitamin D level today. 3. Mammography 07/2015. History of Paget's disease right breast. Exam NED.  Continue with annual mammography when due. SBE monthly reviewed. 4. Colonoscopy 2011. Patient going to check when they recommend repeat colonoscopy. 5. Pap smear/HPV 2017. Pap smear done today. No history of abnormal Pap smears. Currently on immunosuppression for her rheumatoid arthritis. 6. Health maintenance. Does have routine lab work done through her rheumatologist office. Will check baseline lipid profile and vitamin D level as I doubt they do these. Follow up in one year, sooner as needed.   Anastasio Auerbach MD, 10:14 AM 01/23/2016

## 2016-01-23 NOTE — Addendum Note (Signed)
Addended by: Nelva Nay on: 01/23/2016 10:30 AM   Modules accepted: Orders

## 2016-01-24 LAB — PAP IG W/ RFLX HPV ASCU

## 2016-01-24 LAB — URINE CULTURE

## 2016-01-24 LAB — VITAMIN D 25 HYDROXY (VIT D DEFICIENCY, FRACTURES): Vit D, 25-Hydroxy: 46 ng/mL (ref 30–100)

## 2016-02-21 ENCOUNTER — Other Ambulatory Visit: Payer: Self-pay | Admitting: Gynecology

## 2017-01-27 ENCOUNTER — Encounter: Payer: Self-pay | Admitting: Gynecology

## 2017-01-27 ENCOUNTER — Ambulatory Visit (INDEPENDENT_AMBULATORY_CARE_PROVIDER_SITE_OTHER): Payer: 59 | Admitting: Gynecology

## 2017-01-27 VITALS — BP 118/74 | Ht 65.5 in | Wt 136.0 lb

## 2017-01-27 DIAGNOSIS — Z01411 Encounter for gynecological examination (general) (routine) with abnormal findings: Secondary | ICD-10-CM | POA: Diagnosis not present

## 2017-01-27 DIAGNOSIS — C50011 Malignant neoplasm of nipple and areola, right female breast: Secondary | ICD-10-CM | POA: Diagnosis not present

## 2017-01-27 DIAGNOSIS — M810 Age-related osteoporosis without current pathological fracture: Secondary | ICD-10-CM

## 2017-01-27 DIAGNOSIS — E7800 Pure hypercholesterolemia, unspecified: Secondary | ICD-10-CM | POA: Diagnosis not present

## 2017-01-27 DIAGNOSIS — N952 Postmenopausal atrophic vaginitis: Secondary | ICD-10-CM

## 2017-01-27 NOTE — Patient Instructions (Signed)
Follow-up for the bone density as scheduled.  Follow-up in 1 year for annual exam 

## 2017-01-27 NOTE — Progress Notes (Signed)
    STORMEY WILBORN 08/02/1955 [id number]        62 y.o.  [id number] for annual gynecologic exam.  Doing well without gynecologic complaints  Past medical history,surgical history, problem list, medications, allergies, family history and social history were all reviewed and documented as reviewed in the EPIC chart.  ROS:  Performed with pertinent positives and negatives included in the history, assessment and plan.   Additional significant findings : None   Exam: Caryn Bee assistant Vitals:   01/27/17 1006  BP: 118/74  Weight: 136 lb (61.7 kg)  Height: 5' 5.5" (1.664 m)   Body mass index is 22.29 kg/m.  General appearance:  Normal affect, orientation and appearance. Skin: Grossly normal HEENT: Without gross lesions.  No cervical or supraclavicular adenopathy. Thyroid normal.  Lungs:  Clear without wheezing, rales or rhonchi Cardiac: RR, without RMG Abdominal:  Soft, nontender, without masses, guarding, rebound, organomegaly or hernia Breasts:  Examined lying and sitting.  Left without masses, retractions, discharge or axillary adenopathy.  Implant noted.  Right status post mastectomy with reconstruction.  No masses or adenopathy Pelvic:  Ext, BUS, Vagina: With atrophic changes  Cervix: With atrophic changes  Uterus: Anteverted, normal size, shape and contour, midline and mobile nontender   Adnexa: Without masses or tenderness    Anus and perineum: Normal   Rectovaginal: Normal sphincter tone without palpated masses or tenderness.    Assessment/Plan:  62 y.o. [id number] female for annual gynecologic exam.   1. Postmenopausal/atrophic genital changes.  No significant hot flushes, night sweats, vaginal dryness or any bleeding.  Continue to monitor and report any bleeding. 2. Osteoporosis.  DEXA 01/2015 T score -2.5.  Started on alendronate 70 mg weekly 2 years ago doing well.  Has supply but will call when needs more.  Schedule DEXA now a 2-year interval.  Plan 5-year course. 3. Pap  smear 2018.  Pap smear/HPV 2017.  No Pap smear done today.  No history of abnormal Pap smears.  History of low-dose methotrexate for rheumatoid arthritis.  Skipped Pap smear this year but will plan to do asked year given history of immunosuppression. 4. Colonoscopy 2011.  Repeat at their recommended interval. 5. History of right Paget's disease.  Exam NED.  Mammography 08/2016.  Continue with annual mammography when due. 6. Health maintenance.  Patient requests baseline labs.  CBC, CMP, lipid profile, TSH, vitamin D, urine analysis ordered.  Follow-up in 1 year, sooner as needed.   Anastasio Auerbach MD, 10:31 AM 01/27/2017

## 2017-01-28 LAB — COMPREHENSIVE METABOLIC PANEL
AG Ratio: 1.7 (calc) (ref 1.0–2.5)
ALT: 39 U/L — ABNORMAL HIGH (ref 6–29)
AST: 33 U/L (ref 10–35)
Albumin: 4.6 g/dL (ref 3.6–5.1)
Alkaline phosphatase (APISO): 59 U/L (ref 33–130)
BUN: 14 mg/dL (ref 7–25)
CO2: 23 mmol/L (ref 20–32)
Calcium: 9.8 mg/dL (ref 8.6–10.4)
Chloride: 101 mmol/L (ref 98–110)
Creat: 0.66 mg/dL (ref 0.50–0.99)
Globulin: 2.7 g/dL (calc) (ref 1.9–3.7)
Glucose, Bld: 68 mg/dL (ref 65–99)
Potassium: 4.5 mmol/L (ref 3.5–5.3)
Sodium: 136 mmol/L (ref 135–146)
Total Bilirubin: 0.6 mg/dL (ref 0.2–1.2)
Total Protein: 7.3 g/dL (ref 6.1–8.1)

## 2017-01-28 LAB — URINALYSIS W MICROSCOPIC + REFLEX CULTURE
Bacteria, UA: NONE SEEN /HPF
Bilirubin Urine: NEGATIVE
Glucose, UA: NEGATIVE
Hgb urine dipstick: NEGATIVE
Hyaline Cast: NONE SEEN /LPF
Leukocyte Esterase: NEGATIVE
Nitrites, Initial: NEGATIVE
Protein, ur: NEGATIVE
RBC / HPF: NONE SEEN /HPF (ref 0–2)
Specific Gravity, Urine: 1.01 (ref 1.001–1.03)
Squamous Epithelial / LPF: NONE SEEN /HPF (ref <=5)
WBC, UA: NONE SEEN /HPF (ref 0–5)
pH: 5.5 (ref 5.0–8.0)

## 2017-01-28 LAB — LIPID PANEL
Cholesterol: 269 mg/dL — ABNORMAL HIGH (ref <200)
HDL: 139 mg/dL (ref >50)
LDL Cholesterol (Calc): 110 mg/dL (calc) — ABNORMAL HIGH
Non-HDL Cholesterol (Calc): 130 mg/dL (calc) — ABNORMAL HIGH (ref <130)
Total CHOL/HDL Ratio: 1.9 (calc) (ref <5.0)
Triglycerides: 102 mg/dL (ref <150)

## 2017-01-28 LAB — CBC WITH DIFFERENTIAL/PLATELET
Basophils Absolute: 51 cells/uL (ref 0–200)
Basophils Relative: 0.6 %
Eosinophils Absolute: 119 cells/uL (ref 15–500)
Eosinophils Relative: 1.4 %
HCT: 42.1 % (ref 35.0–45.0)
Hemoglobin: 14.7 g/dL (ref 11.7–15.5)
Lymphs Abs: 1428 cells/uL (ref 850–3900)
MCH: 34.5 pg — ABNORMAL HIGH (ref 27.0–33.0)
MCHC: 34.9 g/dL (ref 32.0–36.0)
MCV: 98.8 fL (ref 80.0–100.0)
MPV: 10.3 fL (ref 7.5–12.5)
Monocytes Relative: 7.4 %
Neutro Abs: 6273 cells/uL (ref 1500–7800)
Neutrophils Relative %: 73.8 %
Platelets: 328 10*3/uL (ref 140–400)
RBC: 4.26 10*6/uL (ref 3.80–5.10)
RDW: 12.8 % (ref 11.0–15.0)
Total Lymphocyte: 16.8 %
WBC mixed population: 629 cells/uL (ref 200–950)
WBC: 8.5 10*3/uL (ref 3.8–10.8)

## 2017-01-28 LAB — TSH: TSH: 1.08 mIU/L (ref 0.40–4.50)

## 2017-01-28 LAB — VITAMIN D 25 HYDROXY (VIT D DEFICIENCY, FRACTURES): Vit D, 25-Hydroxy: 48 ng/mL (ref 30–100)

## 2017-02-16 ENCOUNTER — Ambulatory Visit (INDEPENDENT_AMBULATORY_CARE_PROVIDER_SITE_OTHER): Payer: 59

## 2017-02-16 ENCOUNTER — Encounter: Payer: Self-pay | Admitting: Gynecology

## 2017-02-16 ENCOUNTER — Other Ambulatory Visit: Payer: Self-pay | Admitting: Gynecology

## 2017-02-16 DIAGNOSIS — M810 Age-related osteoporosis without current pathological fracture: Secondary | ICD-10-CM

## 2017-03-16 ENCOUNTER — Other Ambulatory Visit: Payer: Self-pay | Admitting: Gynecology

## 2017-09-11 DIAGNOSIS — Z9011 Acquired absence of right breast and nipple: Secondary | ICD-10-CM | POA: Insufficient documentation

## 2018-01-24 ENCOUNTER — Other Ambulatory Visit: Payer: Self-pay | Admitting: Gynecology

## 2018-01-28 ENCOUNTER — Encounter: Payer: Self-pay | Admitting: Gynecology

## 2018-01-28 ENCOUNTER — Ambulatory Visit (INDEPENDENT_AMBULATORY_CARE_PROVIDER_SITE_OTHER): Payer: 59 | Admitting: Gynecology

## 2018-01-28 VITALS — BP 146/90 | Ht 66.0 in | Wt 129.0 lb

## 2018-01-28 DIAGNOSIS — Z853 Personal history of malignant neoplasm of breast: Secondary | ICD-10-CM

## 2018-01-28 DIAGNOSIS — M810 Age-related osteoporosis without current pathological fracture: Secondary | ICD-10-CM

## 2018-01-28 DIAGNOSIS — Z01419 Encounter for gynecological examination (general) (routine) without abnormal findings: Secondary | ICD-10-CM | POA: Diagnosis not present

## 2018-01-28 DIAGNOSIS — N952 Postmenopausal atrophic vaginitis: Secondary | ICD-10-CM

## 2018-01-28 MED ORDER — ALENDRONATE SODIUM 70 MG PO TABS: ORAL_TABLET | ORAL | 4 refills | Status: DC

## 2018-01-28 NOTE — Progress Notes (Signed)
    Carla Lamb Aug 28, 1955 [id number]        63 y.o.  [id number] for annual gynecologic exam.  Without gynecologic complaints  Past medical history,surgical history, problem list, medications, allergies, family history and social history were all reviewed and documented as reviewed in the EPIC chart.  ROS:  Performed with pertinent positives and negatives included in the history, assessment and plan.   Additional significant findings : None   Exam: Caryn Bee assistant Vitals:   01/28/18 1026  BP: (!) 146/90  Weight: 129 lb (58.5 kg)  Height: 5\' 6"  (1.676 m)   Body mass index is 20.82 kg/m.  General appearance:  Normal affect, orientation and appearance. Skin: Grossly normal HEENT: Without gross lesions.  No cervical or supraclavicular adenopathy. Thyroid normal.  Lungs:  Clear without wheezing, rales or rhonchi Cardiac: RR, without RMG Abdominal:  Soft, nontender, without masses, guarding, rebound, organomegaly or hernia Breasts:  Examined lying and sitting.  Left without masses, retractions, discharge or axillary adenopathy.  Right status post mastectomy with reconstruction.  No masses or adenopathy Pelvic:  Ext, BUS, Vagina: With atrophic changes  Cervix: With atrophic changes  Uterus: Anteverted, normal size, shape and contour, midline and mobile nontender   Adnexa: Without masses or tenderness    Anus and perineum: Normal   Rectovaginal: Normal sphincter tone without palpated masses or tenderness.    Assessment/Plan:  63 y.o. [id number] female for annual gynecologic exam.   1. Postmenopausal.  No significant menopausal symptoms or any vaginal bleeding. 2. History of right Paget's disease status post mastectomy.  Exam NED.  Mammography 08/2017.  Continue with annual mammography. 3. Osteoporosis.  DEXA 2017 T score -2.5.  Follow-up DEXA 01/2017 T score -2.3.  On alendronate for 3 years.  Will continue on alendronate and refill x1 year provided.  Repeat DEXA next year at 2-year  interval. 4. Pap smear 2018.  Pap smear/HPV 2017.  No Pap smear done today.  No history of abnormal Pap smears.  Stable long-term relationship.  Will plan repeat Pap smear next year at 3-year interval. 5. Colonoscopy 2011.  Repeat at their recommended interval. 6. Health maintenance.  No routine lab work done as patient does this elsewhere.  Blood pressure 146/90.  She relates that it has been elevated recently.  Recommend follow-up with primary physician for evaluation and probable treatment and patient agrees to call and follow-up with them.  Follow-up here in 1 year.   Anastasio Auerbach MD, 10:51 AM 01/28/2018

## 2018-01-28 NOTE — Patient Instructions (Signed)
Follow-up with your primary physician for recheck your blood pressure and treat if needed.  Follow-up in 1 year for annual gynecologic exam.

## 2018-07-20 ENCOUNTER — Other Ambulatory Visit: Payer: Self-pay | Admitting: Family Medicine

## 2018-07-20 ENCOUNTER — Other Ambulatory Visit: Payer: Self-pay

## 2018-07-20 ENCOUNTER — Ambulatory Visit
Admission: RE | Admit: 2018-07-20 | Discharge: 2018-07-20 | Disposition: A | Discharge status: Home / Self Care | Payer: 59 | Source: Ambulatory Visit | Attending: Family Medicine | Admitting: Family Medicine

## 2018-07-20 DIAGNOSIS — M545 Low back pain, unspecified: Secondary | ICD-10-CM

## 2018-07-20 DIAGNOSIS — M25571 Pain in right ankle and joints of right foot: Secondary | ICD-10-CM

## 2018-10-26 ENCOUNTER — Encounter: Payer: Self-pay | Admitting: Gynecology

## 2019-02-09 ENCOUNTER — Other Ambulatory Visit: Payer: Self-pay

## 2019-02-10 ENCOUNTER — Encounter: Payer: Self-pay | Admitting: Obstetrics and Gynecology

## 2019-02-10 ENCOUNTER — Ambulatory Visit (INDEPENDENT_AMBULATORY_CARE_PROVIDER_SITE_OTHER): Payer: Medicare Other | Admitting: Obstetrics and Gynecology

## 2019-02-10 VITALS — BP 118/74 | Ht 65.0 in | Wt 120.0 lb

## 2019-02-10 DIAGNOSIS — Z01419 Encounter for gynecological examination (general) (routine) without abnormal findings: Secondary | ICD-10-CM

## 2019-02-10 DIAGNOSIS — Z124 Encounter for screening for malignant neoplasm of cervix: Secondary | ICD-10-CM | POA: Diagnosis not present

## 2019-02-10 DIAGNOSIS — M810 Age-related osteoporosis without current pathological fracture: Secondary | ICD-10-CM

## 2019-02-10 NOTE — Patient Instructions (Addendum)
It was nice to meet you today! Please remember to schedule your DEXA scan this year.  Discuss with your primary doctor about scheduling a colonoscopy and any needed screening lab work. I will follow up on the results of your Pap smear from today.

## 2019-02-10 NOTE — Progress Notes (Signed)
ASTERIA DENSMORE 1955-07-24 [id number]  SUBJECTIVE:  64 y.o. [id number] female for annual routine gynecologic exam and Pap smear.  She has been having back pain treated with injections with some relieft.  Taking care of 80 yo mother-in-law at home with her husband which is a stressor for her.   Current Outpatient Medications  Medication Sig Dispense Refill  . alendronate (FOSAMAX) 70 MG tablet TAKE 1 TABLET EVERY 7 DAYS WITH A FULL GLASS OF WATER ON AN EMPTY STOMACH 12 tablet 4  . Cetirizine HCl 10 MG CAPS Take by mouth.    . Cholecalciferol (VITAMIN D) 2000 UNITS CAPS Take 1 capsule by mouth daily.    . folic acid (FOLVITE) 1 MG tablet Take 2 mg by mouth daily.     . Golimumab ([place] ARIA IV) Inject into the vein.    . hydroxychloroquine (PLAQUENIL) 200 MG tablet Take by mouth daily.    . meloxicam (MOBIC) 15 MG tablet Take 15 mg by mouth daily.    . methotrexate (RHEUMATREX) 2.5 MG tablet Take 17.5 mg by mouth once a week. TUESDAYS.  Caution:Chemotherapy. Protect from light. Takes 7 tablets once weekly     No current facility-administered medications for this visit.   Allergies: Patient has no known allergies.  No LMP recorded. Patient is postmenopausal.  Past medical history,surgical history, problem list, medications, allergies, family history and social history were all reviewed and documented as reviewed in the EPIC chart.  ROS:  Feeling well. No dyspnea or chest pain on exertion.  No abdominal pain, change in bowel habits, black or bloody stools.  No urinary tract symptoms. GYN ROS: normal menses, no abnormal bleeding, pelvic pain or discharge, no breast pain or new or enlarging lumps on self exam. No neurological complaints.    OBJECTIVE:  The patient appears well, alert, oriented x 3, in no distress. BP 118/74   Ht 5\' 5"  (1.651 m)   Wt 120 lb (54.4 kg)   BMI 19.97 kg/m  ENT normal.  Neck supple. No cervical or supraclavicular adenopathy or thyromegaly.  Lungs are  clear, good air entry, no wheezes, rhonchi or rales. S1 and S2 normal, no murmurs, regular rate and rhythm.  Abdomen soft without tenderness, guarding, mass or organomegaly.  Neurological is normal, no focal findings.  BREAST EXAM: breasts appear normal, no suspicious masses, no skin or nipple changes or axillary nodes  Seborrheic keratosis on left outer areola. Prior right mastectomy and reconstruction noted.  PELVIC EXAM: VULVA: normal appearing vulva with no masses, tenderness or lesions, atrophic changes noted. VAGINA: normal appearing vagina with normal color and discharge, no lesions, mild atrophy. CERVIX: normal appearing cervix without discharge or lesions, UTERUS: uterus is anteverted, normal size, shape, consistency and nontender, ADNEXA: normal adnexa in size, nontender and no masses  Chaperone: Caryn Bee present during the examination  ASSESSMENT:  64 y.o. [id number] here for annual gynecologic exam  PLAN:    1. Postmenopausal. No significant gynecologic concerns and denies vaginal bleeding. 2. Pap smear 01/2016. Pap smear repeated today. No known history of abnormal Pap smears.  Discussed the option of concluding Pap smear surveillance after age 7 and we will consider her wishes further after her next visit. 3. Mammogram. Gets this done with [facility] and will be due in August.  Will continue with annual mammography.  History of right Paget's disease s/p mastectomy.  Breast exam normal today. 4. Colonoscopy 2011. Recommended that she pursue getting this done this year, and she will plan  to coordinate with her primary care doctor's office 5. Osteoporosis. Treating with Fosamax.  Repeat DEXA indicated this year and she plans to schedule.  6. Health maintenance.  She will plan to coordinate screening lab work with her primary care doctor soon.  Return annually or prn.  Larey Days MD  02/10/19

## 2019-02-13 LAB — PAP IG W/ RFLX HPV ASCU

## 2019-02-14 NOTE — Addendum Note (Signed)
Addended by: Nelva Nay on: 02/14/2019 12:51 PM   Modules accepted: Orders

## 2019-02-15 NOTE — Progress Notes (Signed)
Please let the patient know that the recent Pap smear did not collect enough cells to determine any results.  As her last Pap smear was normal, her option is to either come back for a repeat Pap smear soon, or I think it would be okay if she waited until her annual exam next year.

## 2019-02-27 ENCOUNTER — Other Ambulatory Visit: Payer: Self-pay

## 2019-06-06 ENCOUNTER — Other Ambulatory Visit: Payer: Self-pay | Admitting: Rheumatology

## 2019-06-06 DIAGNOSIS — R7989 Other specified abnormal findings of blood chemistry: Secondary | ICD-10-CM

## 2019-06-06 DIAGNOSIS — R945 Abnormal results of liver function studies: Secondary | ICD-10-CM

## 2019-06-20 ENCOUNTER — Ambulatory Visit
Admission: RE | Admit: 2019-06-20 | Discharge: 2019-06-20 | Disposition: A | Discharge status: Home / Self Care | Payer: Medicare Other | Source: Ambulatory Visit | Attending: Rheumatology | Admitting: Rheumatology

## 2019-06-20 DIAGNOSIS — R945 Abnormal results of liver function studies: Secondary | ICD-10-CM

## 2019-06-20 DIAGNOSIS — R7989 Other specified abnormal findings of blood chemistry: Secondary | ICD-10-CM

## 2019-08-18 ENCOUNTER — Other Ambulatory Visit: Payer: Self-pay | Admitting: Family Medicine

## 2019-08-18 DIAGNOSIS — Q442 Atresia of bile ducts: Secondary | ICD-10-CM

## 2019-08-29 ENCOUNTER — Other Ambulatory Visit: Payer: Self-pay | Admitting: Family Medicine

## 2019-08-29 DIAGNOSIS — K838 Other specified diseases of biliary tract: Secondary | ICD-10-CM

## 2019-09-19 ENCOUNTER — Ambulatory Visit
Admission: RE | Admit: 2019-09-19 | Discharge: 2019-09-19 | Disposition: A | Discharge status: Home / Self Care | Payer: Medicare Other | Source: Ambulatory Visit | Attending: Family Medicine | Admitting: Family Medicine

## 2019-09-19 ENCOUNTER — Other Ambulatory Visit: Payer: Self-pay

## 2019-09-19 DIAGNOSIS — K838 Other specified diseases of biliary tract: Secondary | ICD-10-CM

## 2019-09-19 MED ORDER — GADOBENATE DIMEGLUMINE 529 MG/ML IV SOLN
11.0000 mL | Freq: Once | INTRAVENOUS | Status: AC | PRN
  Administered 2019-09-19: 11 mL via INTRAVENOUS

## 2019-09-21 ENCOUNTER — Other Ambulatory Visit: Payer: Medicare Other

## 2020-02-16 ENCOUNTER — Ambulatory Visit (INDEPENDENT_AMBULATORY_CARE_PROVIDER_SITE_OTHER): Payer: Medicare Other | Admitting: Obstetrics and Gynecology

## 2020-02-16 ENCOUNTER — Other Ambulatory Visit: Payer: Self-pay

## 2020-02-16 ENCOUNTER — Encounter: Payer: Self-pay | Admitting: Obstetrics and Gynecology

## 2020-02-16 VITALS — BP 114/74 | HR 72 | Ht 64.57 in | Wt 131.0 lb

## 2020-02-16 DIAGNOSIS — Z124 Encounter for screening for malignant neoplasm of cervix: Secondary | ICD-10-CM | POA: Diagnosis not present

## 2020-02-16 DIAGNOSIS — M810 Age-related osteoporosis without current pathological fracture: Secondary | ICD-10-CM | POA: Diagnosis not present

## 2020-02-16 DIAGNOSIS — Z01419 Encounter for gynecological examination (general) (routine) without abnormal findings: Secondary | ICD-10-CM

## 2020-02-16 NOTE — Progress Notes (Signed)
GER NICKS May 10, 1955 [id number]  SUBJECTIVE:  65 y.o. [id number] female for annual routine gynecologic exam and Pap smear. No gynecologic concerns at this time.   Current Outpatient Medications  Medication Sig Dispense Refill  . alendronate (FOSAMAX) 70 MG tablet TAKE 1 TABLET EVERY 7 DAYS WITH A FULL GLASS OF WATER ON AN EMPTY STOMACH 12 tablet 4  . Cetirizine HCl 10 MG CAPS Take by mouth.    . Cholecalciferol (VITAMIN D) 2000 UNITS CAPS Take 1 capsule by mouth daily.    . folic acid (FOLVITE) 1 MG tablet Take 2 mg by mouth daily.     . Golimumab ([place] ARIA IV) Inject into the vein.    . hydroxychloroquine (PLAQUENIL) 200 MG tablet Take by mouth daily.    . meloxicam (MOBIC) 15 MG tablet Take 15 mg by mouth daily.    . methotrexate (RHEUMATREX) 2.5 MG tablet Take 17.5 mg by mouth once a week. TUESDAYS.  Caution:Chemotherapy. Protect from light. Takes 7 tablets once weekly     No current facility-administered medications for this visit.   Allergies: Patient has no known allergies.  No LMP recorded. Patient is postmenopausal.  Past medical history,surgical history, problem list, medications, allergies, family history and social history were all reviewed and documented as reviewed in the EPIC chart.  ROS: Positives and negatives as reviewed in HPI    OBJECTIVE:  BP 114/74 (BP Location: Left Arm, Patient Position: Sitting)   Pulse 72   Ht 5' 4.57" (1.64 m)   Wt 131 lb (59.4 kg)   BMI 22.09 kg/m    The patient appears well, alert, oriented, in no distress. BREAST EXAM: breasts appear normal, no suspicious masses, no skin or nipple changes or axillary nodes  Prior right mastectomy and reconstruction noted.  PELVIC EXAM: VULVA: normal appearing vulva with no masses, tenderness or lesions, atrophic changes noted. VAGINA: normal appearing vagina with normal color and discharge, no lesions, mild atrophy. CERVIX: normal appearing cervix without discharge or lesions, UTERUS:  uterus is anteverted, normal size, shape, consistency and nontender, ADNEXA: normal adnexa in size, nontender and no masses  Chaperone: Glorianne Manchester RN present during the examination  ASSESSMENT:  65 y.o. [id number] here for annual gynecologic exam  PLAN:   1. Postmenopausal. No significant gynecologic concerns and denies vaginal bleeding. 2. Pap smear 01/2016.  Pap smear repeated today.  Pap smear was attempted at visit last year but returned with insufficient cells for analysis and she decided to defer retesting until this year. No known history of abnormal Pap smears.  Previously had discussed the option of concluding Pap smear surveillance after age 71 and we will consider her wishes further after her next visit. 3. History of right Paget's disease s/p mastectomy.  Mammogram 09/2019 and gets this done with [facility].  Will continue with annual mammography.  Breast exam normal today. 4. Colonoscopy 2011.  I had recommended that she look into getting this done.  Names and contacts are available upon request at checkout today.   5. Osteoporosis. Treating with Fosamax.  Admits to inconsistent use and indicates she does not need a refill at this point.  Repeat DEXA had been recommended last year and did not get done due to pandemic and familial obligations.  I recommended that she get this scheduled she will plan to do so in the coming months.  The patient is aware that I will only be at this practice until early March 2022 so she knows to make sure  she requests follow-up on results for any medical tests that she does when I am no longer at the practice. 6. Health maintenance.  Routine labs are done elsewhere.  Return annually or prn.  Joseph Pierini MD  02/16/20

## 2020-02-16 NOTE — Addendum Note (Signed)
Addended by: Burnice Logan on: 02/16/2020 04:49 PM   Modules accepted: Orders

## 2020-02-19 LAB — PAP IG W/ RFLX HPV ASCU

## 2020-04-23 ENCOUNTER — Encounter (INDEPENDENT_AMBULATORY_CARE_PROVIDER_SITE_OTHER): Payer: Medicare Other

## 2020-04-23 ENCOUNTER — Other Ambulatory Visit: Payer: Self-pay

## 2020-04-23 ENCOUNTER — Other Ambulatory Visit: Payer: Self-pay | Admitting: Obstetrics and Gynecology

## 2020-04-23 DIAGNOSIS — Z780 Asymptomatic menopausal state: Secondary | ICD-10-CM | POA: Diagnosis not present

## 2020-04-23 DIAGNOSIS — Z01419 Encounter for gynecological examination (general) (routine) without abnormal findings: Secondary | ICD-10-CM

## 2020-04-23 DIAGNOSIS — M810 Age-related osteoporosis without current pathological fracture: Secondary | ICD-10-CM | POA: Diagnosis not present

## 2020-04-23 DIAGNOSIS — Z124 Encounter for screening for malignant neoplasm of cervix: Secondary | ICD-10-CM

## 2020-05-20 ENCOUNTER — Ambulatory Visit (INDEPENDENT_AMBULATORY_CARE_PROVIDER_SITE_OTHER): Payer: Medicare Other | Admitting: Obstetrics & Gynecology

## 2020-05-20 ENCOUNTER — Other Ambulatory Visit: Payer: Self-pay

## 2020-05-20 ENCOUNTER — Encounter: Payer: Self-pay | Admitting: Obstetrics & Gynecology

## 2020-05-20 VITALS — BP 130/80

## 2020-05-20 DIAGNOSIS — M810 Age-related osteoporosis without current pathological fracture: Secondary | ICD-10-CM

## 2020-05-20 NOTE — Progress Notes (Signed)
    Carla Lamb 1955-03-24 [id number]        65 y.o.  [id number]   RP: Counseling and management of Osteoporosis  HPI: Patient on Fosamax x 3 yrs, practically off of medication x 1 year (taking it rarely).  No problem with balance.  No recent fall.     OB History  Gravida Para Term Preterm AB Living  5 1     4 1   SAB IAB Ectopic Multiple Live Births  4            # Outcome Date GA Lbr Len/2nd Weight Sex Delivery Anes PTL Lv  5 SAB           4 SAB           3 SAB           2 SAB           1 Para             Past medical history,surgical history, problem list, medications, allergies, family history and social history were all reviewed and documented in the EPIC chart.   Directed ROS with pertinent positives and negatives documented in the history of present illness/assessment and plan.  Exam:  Vitals:   05/20/20 1447  BP: 130/80   General appearance:  Normal  Bone Density 04/23/2020:  Osteoporosis with T-Score at -2.5 at the Rt Femoral Neck.  Significant decrease in Bone Density at the Total Rt Hip and Lumbar Spine.   Assessment/Plan:  65 y.o. [id number]   1. Age-related osteoporosis without current pathological fracture Osteoporosis on BD 04/23/2020 with a T-Score at -2.5 at the Rt Femoral Neck.  Significant decrease in BD at the Total Rt Hip and Lumbar Spine.  Counseling done on findings and management choices.  Decision to start on Prolia.  Usage, risks and benefits of Prolia reviewed.  Pamphlet given.  Will verify Ca++ level, obtain report from Rheumatologist.  Patient voiced understanding and agreement with plan.  Princess Bruins MD, 2:57 PM 05/20/2020

## 2020-05-21 ENCOUNTER — Encounter: Payer: Self-pay | Admitting: Obstetrics & Gynecology

## 2020-05-23 ENCOUNTER — Telehealth: Payer: Self-pay | Admitting: *Deleted

## 2020-05-23 NOTE — Telephone Encounter (Signed)
-----   Message from [place], [place] sent at 05/21/2020  8:14 AM EDT ----- Regarding: new start prolia Start Prolia per ML

## 2020-05-23 NOTE — Telephone Encounter (Signed)
Prolia insurance verification has been sent awaiting Summary of benefits  

## 2020-06-10 NOTE — Telephone Encounter (Addendum)
Deductible $233  OOP MAX  n/a   Annual exam 02/16/2020  Calcium   10.3       Date 05/24/2020 with [facility]   Upcoming dental procedures   Prior Authorization needed   Pt estimated Cost $0  APPT 09/20/2020 Pt has dental procedure 08/07/2020 can have Prolia Sep 1st will call pt around that time     Coverage Details: $0 one dose, $0 admin fee

## 2020-09-09 ENCOUNTER — Telehealth: Payer: Self-pay

## 2020-09-09 NOTE — Telephone Encounter (Signed)
Patient is scheduled for Prolia injection 09/19/20.  She had steroid injection in her hip today. She is scheduled for infusion of [place] Aria (for Rheumatoid Arthritis) on 09/13/20.  She wants to make sure neither of these things will interfere with her scheduled Prolia Inj.

## 2020-09-12 NOTE — Telephone Encounter (Addendum)
I left message for pt to return our call as well as I canceled appointment for Prolia. Once patient calls me back I will schedule Prolia 1 month post Siponia Aria per Dr. Dellis Filbert.    Pt called back appt scheduled for 10/11/2020

## 2020-09-12 NOTE — Telephone Encounter (Signed)
Recommend postponing Prolia to 1 month post Siponia Aria because of an increased risk of infection.

## 2020-09-12 NOTE — Telephone Encounter (Signed)
left message for pt to return our call as well as I canceled appointment for Prolia. Once patient calls me back I will schedule Prolia 1 month post Siponia Aria per Dr. Dellis Filbert.       Pt called back appt scheduled for 10/11/2020

## 2020-09-19 ENCOUNTER — Ambulatory Visit: Payer: Medicare Other

## 2020-10-11 ENCOUNTER — Other Ambulatory Visit: Payer: Self-pay

## 2020-10-11 ENCOUNTER — Ambulatory Visit (INDEPENDENT_AMBULATORY_CARE_PROVIDER_SITE_OTHER): Payer: Medicare Other | Admitting: *Deleted

## 2020-10-11 DIAGNOSIS — M810 Age-related osteoporosis without current pathological fracture: Secondary | ICD-10-CM | POA: Diagnosis not present

## 2020-10-11 MED ORDER — DENOSUMAB 60 MG/ML ~~LOC~~ SOSY
60.0000 mg | PREFILLED_SYRINGE | Freq: Once | SUBCUTANEOUS | Status: AC
  Administered 2020-10-11: 60 mg via SUBCUTANEOUS

## 2020-11-18 ENCOUNTER — Encounter: Payer: Self-pay | Admitting: Physician Assistant

## 2020-11-18 ENCOUNTER — Other Ambulatory Visit: Payer: Self-pay

## 2020-11-18 ENCOUNTER — Ambulatory Visit (INDEPENDENT_AMBULATORY_CARE_PROVIDER_SITE_OTHER): Payer: Medicare Other | Admitting: Physician Assistant

## 2020-11-18 VITALS — BP 138/83 | HR 76 | Temp 98.1°F | Ht 65.0 in | Wt 123.0 lb

## 2020-11-18 DIAGNOSIS — Z720 Tobacco use: Secondary | ICD-10-CM

## 2020-11-18 DIAGNOSIS — M810 Age-related osteoporosis without current pathological fracture: Secondary | ICD-10-CM | POA: Diagnosis not present

## 2020-11-18 DIAGNOSIS — C50911 Malignant neoplasm of unspecified site of right female breast: Secondary | ICD-10-CM

## 2020-11-18 DIAGNOSIS — Z01818 Encounter for other preprocedural examination: Secondary | ICD-10-CM

## 2020-11-18 DIAGNOSIS — E785 Hyperlipidemia, unspecified: Secondary | ICD-10-CM | POA: Diagnosis not present

## 2020-11-18 DIAGNOSIS — M069 Rheumatoid arthritis, unspecified: Secondary | ICD-10-CM | POA: Diagnosis not present

## 2020-11-18 DIAGNOSIS — J3089 Other allergic rhinitis: Secondary | ICD-10-CM

## 2020-11-18 DIAGNOSIS — Z171 Estrogen receptor negative status [ER-]: Secondary | ICD-10-CM

## 2020-11-18 DIAGNOSIS — Z7689 Persons encountering health services in other specified circumstances: Secondary | ICD-10-CM

## 2020-11-18 DIAGNOSIS — R030 Elevated blood-pressure reading, without diagnosis of hypertension: Secondary | ICD-10-CM

## 2020-11-18 NOTE — Progress Notes (Signed)
New Patient Office Visit  Subjective:  Patient ID: Carla Lamb, female    DOB: [birth year]  Age: 65 y.o. MRN: [id number]  CC:  Chief Complaint  Patient presents with   New Patient (Initial Visit)    HPI [name] presents to establish care. Patient has a past medical hx of breast cancer, rheumatoid arthritis, osteoporosis, and situational anxiety and depression. Is followed by [name] rheumatology [name]) and RA treatment includes Simponi Aria IV, meloxicam, hydroxychloroquine, methotrexate and folic acid. On Prolia for osteoporosis which is followed by her OB/GYN.  States is planning to have hip replacement and needs surgical clearance, followed by [name] and [name]. Patient denies prior hx of hypertension, thyroid issues, heart diease or diabetes.  Patient reports remote hx of Covid-19 infection and since then has noticed her blood pressure being higher than before. No chest pain, shortness of breath, dizziness or lower extremity edema. No longer taking cetrizine for seasonal allergies. But started having some nasal symptoms last few days. States started smoking again 5 years ago, smokes about 0.25 PPD.    Past Medical History:  Diagnosis Date   Anxiety    Bursitis    Cancer ([place])    Right Mastectomy [place]gets Disease   Depression    During treament   Osteoporosis 01/2017   2017 T score -2.5 2019 T score -2.3.   RA (rheumatoid arthritis) [facility])     Past Surgical History:  Procedure Laterality Date   ANTERIOR CERVICAL DECOMP/DISCECTOMY FUSION  08/25/2011   Procedure: ANTERIOR CERVICAL DECOMPRESSION/DISCECTOMY FUSION 3 LEVELS;  Surgeon: Floyce Stakes, MD;  Location: [facility];  Service: Neurosurgery;  Laterality: N/A;  Anterior Cervical Decompression/Discectomy Fusion Cervical Four-Five Cervical Six Corpectomy, Fusion to cervical Seven.   bunions     bilat feet   CERVICAL FUSION  08/25/2011   C 4  5 & 6   MASTECTOMY  2006   Right   Vaginal Cyst Removed  2010     Family History  Problem Relation Age of Onset   Cancer Father        LUNG- SMOKER   Cancer Brother 15       RENAL   Breast cancer Mother 70   Hypertension Mother    Cancer Maternal Grandfather        Colon   Diverticulitis Sister    Social History   Socioeconomic History   Marital status: Married    Spouse name: Not on file   Number of children: Not on file   Years of education: Not on file   Highest education level: Not on file  Occupational History   Not on file  Tobacco Use   Smoking status: Every Day    Years: 0.00    Types: Cigarettes    Last attempt to quit: 01/19/1978    Years since quitting: 42.8   Smokeless tobacco: Never  Vaping Use   Vaping Use: Never used  Substance and Sexual Activity   Alcohol use: Yes    Alcohol/week: 12.0 standard drinks    Types: 12 Cans of beer per week   Drug use: No   Sexual activity: Not Currently    Birth control/protection: Post-menopausal    Comment: 1st intercourse 65 yo-Fewer than 5 partners  Other Topics Concern   Not on file  Social History Narrative   Not on file   Social Determinants of Health   Financial Resource Strain: Not on file  Food Insecurity: Not on file  Transportation  Needs: Not on file  Physical Activity: Not on file  Stress: Not on file  Social Connections: Not on file  Intimate Partner Violence: Not on file    ROS Review of Systems Review of Systems:  A fourteen system review of systems was performed and found to be positive as per HPI.  Objective:   Today's Vitals: BP 138/83   Pulse 76   Temp 98.1 F (36.7 C)   Ht '5\' 5"'  (1.651 m)   Wt 123 lb (55.8 kg)   SpO2 100%   BMI 20.47 kg/m   Physical Exam General:  Pleasant and cooperative, in no acute distress  Neuro:  Alert and oriented,  extra-ocular muscles intact  HEENT:  Normocephalic, atraumatic, PERRL, neck supple Skin:  no gross rash, warm, pink. Cardiac:  RRR, S1 S2 Respiratory: Mild expiratory wheezing noted, no crackles or  rales. Not using accessory muscles, speaking in full sentences- unlabored. Vascular:  Ext warm, no cyanosis apprec.; cap RF less 2 sec. Psych:  No HI/SI, judgement and insight good, Euthymic mood. Full Affect.  Assessment & Plan:   Problem List Items Addressed This Visit   None Visit Diagnoses     Encounter to establish care    -  Primary   Age-related osteoporosis without current pathological fracture       Relevant Orders   Vitamin D (25 hydroxy)   Malignant neoplasm of right breast in female, estrogen receptor negative, unspecified site of breast ([place])       Rheumatoid arthritis, involving unspecified site, unspecified whether rheumatoid factor present ([place])       Elevated blood pressure reading in office without diagnosis of hypertension       Relevant Orders   CBC w/Diff   Comp Met (CMET)   Lipid Profile   Preoperative clearance       Relevant Orders   CBC w/Diff   Comp Met (CMET)   Lipid Profile   Vitamin D (25 hydroxy)   Hyperlipidemia, unspecified hyperlipidemia type       Relevant Orders   Lipid Profile   Environmental and seasonal allergies       Tobacco use          Encounter to establish care: -Will review previous PCP records once obtained. -Patient is fasting, will collect labs. Per chart review, lipid panel elevated in the past.  -Will contact [name] and [place] regarding preoperative clearance form/requirements.  Rheumatoid arthritis: -Followed by [facility]ology. -Will request most recent records. -Continue current medication regimen.  Malignant neoplasm of right breast in female, estrogen receptor negative, unspecified site of breast: -Reviewed notes in Care Everywhere. -S/p right mastectomy with adjuvant chemotherapy with Adriamycin and Cytoxan. -Followed by Hematology/Oncology and Plastic & Reconstructive surgery.  Age-related osteoporosis without current pathological fracture: -On Prolia. Previously on fosamax. -Followed by  OB/GYN.  Elevated blood pressure reading in office without diagnosis of hypertension: -Initial BP elevated, BP recheck improved and stable. -Will repeat BP at follow up visit. -Advised to start monitoring BP daily and keep a log to bring to next OV.  Environmental and seasonal allergies: -Recommend to resume oral antihistamine especially with mild expiratory wheezing on exam. -Oxygen sat 100 % on RA.  Outpatient Encounter Medications as of 11/18/2020  Medication Sig   Cholecalciferol (VITAMIN D) 2000 UNITS CAPS Take 1 capsule by mouth daily.   folic acid (FOLVITE) 1 MG tablet Take 2 mg by mouth daily.   Golimumab ([place] ARIA IV) Inject into the vein.   hydroxychloroquine (  PLAQUENIL) 200 MG tablet Take by mouth daily.   meloxicam (MOBIC) 15 MG tablet Take 15 mg by mouth daily.   methotrexate (RHEUMATREX) 2.5 MG tablet Take 17.5 mg by mouth once a week. TUESDAYS.  Caution:Chemotherapy. Protect from light. Takes 7 tablets once weekly   [DISCONTINUED] alendronate (FOSAMAX) 70 MG tablet TAKE 1 TABLET EVERY 7 DAYS WITH A FULL GLASS OF WATER ON AN EMPTY STOMACH   [DISCONTINUED] Cetirizine HCl 10 MG CAPS Take by mouth.   No facility-administered encounter medications on file as of 11/18/2020.    Follow-up: Return for next available for surgical clearance, elevated BP.   Lorrene Reid, PA-C

## 2020-11-19 LAB — CBC WITH DIFFERENTIAL/PLATELET
Basophils Absolute: 0 10*3/uL (ref 0.0–0.2)
Basos: 1 %
EOS (ABSOLUTE): 0.1 10*3/uL (ref 0.0–0.4)
Eos: 1 %
Hematocrit: 43.6 % (ref 34.0–46.6)
Hemoglobin: 15 g/dL (ref 11.1–15.9)
Immature Grans (Abs): 0 10*3/uL (ref 0.0–0.1)
Immature Granulocytes: 0 %
Lymphocytes Absolute: 2.5 10*3/uL (ref 0.7–3.1)
Lymphs: 34 %
MCH: 33.6 pg — ABNORMAL HIGH (ref 26.6–33.0)
MCHC: 34.4 g/dL (ref 31.5–35.7)
MCV: 98 fL — ABNORMAL HIGH (ref 79–97)
Monocytes Absolute: 0.6 10*3/uL (ref 0.1–0.9)
Monocytes: 8 %
Neutrophils Absolute: 4.1 10*3/uL (ref 1.4–7.0)
Neutrophils: 56 %
Platelets: 324 10*3/uL (ref 150–450)
RBC: 4.47 x10E6/uL (ref 3.77–5.28)
RDW: 13.1 % (ref 11.7–15.4)
WBC: 7.3 10*3/uL (ref 3.4–10.8)

## 2020-11-19 LAB — LIPID PANEL
Chol/HDL Ratio: 2.2 ratio (ref 0.0–4.4)
Cholesterol, Total: 264 mg/dL — ABNORMAL HIGH (ref 100–199)
HDL: 121 mg/dL (ref >39)
LDL Chol Calc (NIH): 128 mg/dL — ABNORMAL HIGH (ref 0–99)
Triglycerides: 92 mg/dL (ref 0–149)
VLDL Cholesterol Cal: 15 mg/dL (ref 5–40)

## 2020-11-19 LAB — COMPREHENSIVE METABOLIC PANEL
ALT: 28 IU/L (ref 0–32)
AST: 24 IU/L (ref 0–40)
Albumin/Globulin Ratio: 2 (ref 1.2–2.2)
Albumin: 4.7 g/dL (ref 3.8–4.8)
Alkaline Phosphatase: 97 IU/L (ref 44–121)
BUN/Creatinine Ratio: 13 (ref 12–28)
BUN: 10 mg/dL (ref 8–27)
Bilirubin Total: 0.3 mg/dL (ref 0.0–1.2)
CO2: 25 mmol/L (ref 20–29)
Calcium: 9.6 mg/dL (ref 8.7–10.3)
Chloride: 103 mmol/L (ref 96–106)
Creatinine, Ser: 0.78 mg/dL (ref 0.57–1.00)
Globulin, Total: 2.4 g/dL (ref 1.5–4.5)
Glucose: 88 mg/dL (ref 70–99)
Potassium: 4.4 mmol/L (ref 3.5–5.2)
Sodium: 140 mmol/L (ref 134–144)
Total Protein: 7.1 g/dL (ref 6.0–8.5)
eGFR: 83 mL/min/{1.73_m2} (ref >59)

## 2020-11-19 LAB — VITAMIN D 25 HYDROXY (VIT D DEFICIENCY, FRACTURES): Vit D, 25-Hydroxy: 27.3 ng/mL — ABNORMAL LOW (ref 30.0–100.0)

## 2020-11-21 ENCOUNTER — Encounter: Payer: Self-pay | Admitting: Physician Assistant

## 2020-11-21 ENCOUNTER — Ambulatory Visit (INDEPENDENT_AMBULATORY_CARE_PROVIDER_SITE_OTHER): Payer: Medicare Other | Admitting: Physician Assistant

## 2020-11-21 ENCOUNTER — Other Ambulatory Visit: Payer: Self-pay

## 2020-11-21 VITALS — BP 120/82 | HR 71 | Temp 97.5°F | Ht 65.0 in | Wt 123.2 lb

## 2020-11-21 DIAGNOSIS — E785 Hyperlipidemia, unspecified: Secondary | ICD-10-CM

## 2020-11-21 DIAGNOSIS — Z01818 Encounter for other preprocedural examination: Secondary | ICD-10-CM | POA: Diagnosis not present

## 2020-11-21 DIAGNOSIS — M069 Rheumatoid arthritis, unspecified: Secondary | ICD-10-CM

## 2020-11-21 DIAGNOSIS — C50911 Malignant neoplasm of unspecified site of right female breast: Secondary | ICD-10-CM

## 2020-11-21 DIAGNOSIS — Z171 Estrogen receptor negative status [ER-]: Secondary | ICD-10-CM

## 2020-11-21 DIAGNOSIS — E559 Vitamin D deficiency, unspecified: Secondary | ICD-10-CM

## 2020-11-21 NOTE — Progress Notes (Signed)
Established Patient Office Visit  Subjective:  Patient ID: Carla Lamb, female    DOB: 1955/04/14  Age: 65 y.o. MRN: [id number]  CC:  Chief Complaint  Patient presents with   surgical  clearence    HPI Carla Lamb presents for surgical clearance. Patient is planning to have left hip replacement. Reports has been dealing with left hip pain for several months. Pain is starting to interfere with her daily activities. States has trouble with walking but also cannot sit for a prolonged period. Has been sleeping on a regular couch the past few months, states unable to lay flat. No prior surgical complications or issues with anesthesia. Denies chest pain, palpitations, shortness of breath, dizziness, or syncope. [facility] for chronic back pain and issues.  Past Medical History:  Diagnosis Date   Anxiety    Bursitis    Cancer ([place])    Right Mastectomy [facility] Disease   Depression    During treament   Osteoporosis 01/2017   2017 T score -2.5 2019 T score -2.3.   RA (rheumatoid arthritis) [facility])     Past Surgical History:  Procedure Laterality Date   ANTERIOR CERVICAL DECOMP/DISCECTOMY FUSION  08/25/2011   Procedure: ANTERIOR CERVICAL DECOMPRESSION/DISCECTOMY FUSION 3 LEVELS;  Surgeon: Floyce Stakes, MD;  Location: [facility];  Service: Neurosurgery;  Laterality: N/A;  Anterior Cervical Decompression/Discectomy Fusion Cervical Four-Five Cervical Six Corpectomy, Fusion to cervical Seven.   bunions     bilat feet   CERVICAL FUSION  08/25/2011   C 4  5 & 6   MASTECTOMY  2006   Right   Vaginal Cyst Removed  2010    Family History  Problem Relation Age of Onset   Cancer Father        LUNG- SMOKER   Cancer Brother 66       RENAL   Breast cancer Mother 44   Hypertension Mother    Cancer Maternal Grandfather        Colon   Diverticulitis Sister     Social History   Socioeconomic History   Marital status: Married    Spouse name: Not on file   Number of  children: Not on file   Years of education: Not on file   Highest education level: Not on file  Occupational History   Not on file  Tobacco Use   Smoking status: Every Day    Years: 0.00    Types: Cigarettes    Last attempt to quit: 01/19/1978    Years since quitting: 42.8   Smokeless tobacco: Never  Vaping Use   Vaping Use: Never used  Substance and Sexual Activity   Alcohol use: Yes    Alcohol/week: 12.0 standard drinks    Types: 12 Cans of beer per week   Drug use: No   Sexual activity: Not Currently    Birth control/protection: Post-menopausal    Comment: 1st intercourse 65 yo-Fewer than 5 partners  Other Topics Concern   Not on file  Social History Narrative   Not on file   Social Determinants of Health   Financial Resource Strain: Not on file  Food Insecurity: Not on file  Transportation Needs: Not on file  Physical Activity: Not on file  Stress: Not on file  Social Connections: Not on file  Intimate Partner Violence: Not on file    Outpatient Medications Prior to Visit  Medication Sig Dispense Refill   Cholecalciferol (VITAMIN D) 2000 UNITS CAPS Take 1 capsule  by mouth daily.     folic acid (FOLVITE) 1 MG tablet Take 2 mg by mouth daily.     Golimumab (Orbisonia ARIA IV) Inject into the vein.     hydroxychloroquine (PLAQUENIL) 200 MG tablet Take by mouth daily.     meloxicam (MOBIC) 15 MG tablet Take 15 mg by mouth daily.     methotrexate (RHEUMATREX) 2.5 MG tablet Take 17.5 mg by mouth once a week. TUESDAYS.  Caution:Chemotherapy. Protect from light. Takes 7 tablets once weekly     No facility-administered medications prior to visit.    No Known Allergies  ROS Review of Systems Review of Systems:  A fourteen system review of systems was performed and found to be positive as per HPI.   Objective:    Physical Exam Constitutional:      General: She is not in acute distress.    Appearance: Normal appearance.  HENT:     Head: Normocephalic and  atraumatic.     Right Ear: There is impacted cerumen.     Left Ear: There is impacted cerumen.     Nose: Nose normal.     Mouth/Throat:     Mouth: Mucous membranes are moist.     Pharynx: Oropharynx is clear.  Eyes:     Extraocular Movements: Extraocular movements intact.     Conjunctiva/sclera: Conjunctivae normal.     Pupils: Pupils are equal, round, and reactive to light.  Cardiovascular:     Rate and Rhythm: Normal rate and regular rhythm.     Pulses: Normal pulses.     Heart sounds: Normal heart sounds. No murmur heard. Pulmonary:     Effort: Pulmonary effort is normal. No respiratory distress.     Breath sounds: Normal breath sounds. No stridor. No wheezing or rhonchi.  Abdominal:     General: Abdomen is flat. Bowel sounds are normal. There is no distension.     Palpations: Abdomen is soft.     Tenderness: There is no abdominal tenderness. There is no right CVA tenderness, left CVA tenderness or guarding.  Musculoskeletal:        General: Tenderness present. No swelling.     Cervical back: Normal range of motion and neck supple.     Right lower leg: No edema.     Left lower leg: No edema.  Skin:    General: Skin is warm and dry.     Capillary Refill: Capillary refill takes less than 2 seconds.     Findings: No rash.  Neurological:     General: No focal deficit present.     Mental Status: She is alert.  Psychiatric:        Mood and Affect: Mood normal.        Behavior: Behavior normal.     BP 120/82   Pulse 71   Temp (!) 97.5 F (36.4 C)   Ht _0  (1.651 m)   Wt 123 lb 3.2 oz (55.9 kg)   SpO2 100%   BMI 20.50 kg/m  Wt Readings from Last 3 Encounters:  11/21/20 123 lb 3.2 oz (55.9 kg)  11/18/20 123 lb (55.8 kg)  02/16/20 131 lb (59.4 kg)     Health Maintenance Due  Topic Date Due   COVID-19 Vaccine (1) Never done   Pneumonia Vaccine 17+ Years old (1 - PCV) Never done   Hepatitis C Screening  Never done   Zoster Vaccines- Shingrix (1 of 2) Never done    COLONOSCOPY (Pts 45-24yr Insurance coverage  will need to be confirmed)  Never done   MAMMOGRAM  Never done   INFLUENZA VACCINE  Never done    There are no preventive care reminders to display for this patient.  Lab Results  Component Value Date   TSH 1.08 01/27/2017   Lab Results  Component Value Date   WBC 7.3 11/18/2020   HGB 15.0 11/18/2020   HCT 43.6 11/18/2020   MCV 98 (H) 11/18/2020   PLT 324 11/18/2020   Lab Results  Component Value Date   NA 140 11/18/2020   K 4.4 11/18/2020   CO2 25 11/18/2020   GLUCOSE 88 11/18/2020   BUN 10 11/18/2020   CREATININE 0.78 11/18/2020   BILITOT 0.3 11/18/2020   ALKPHOS 97 11/18/2020   AST 24 11/18/2020   ALT 28 11/18/2020   PROT 7.1 11/18/2020   ALBUMIN 4.7 11/18/2020   CALCIUM 9.6 11/18/2020   EGFR 83 11/18/2020   Lab Results  Component Value Date   CHOL 264 (H) 11/18/2020   Lab Results  Component Value Date   HDL 121 11/18/2020   Lab Results  Component Value Date   LDLCALC 128 (H) 11/18/2020   Lab Results  Component Value Date   TRIG 92 11/18/2020   Lab Results  Component Value Date   CHOLHDL 2.2 11/18/2020   No results found for: HGBA1C   Depression screen [facility] 2/9 11/21/2020 11/18/2020  Decreased Interest 1 1  Down, Depressed, Hopeless 1 1  PHQ - 2 Score 2 2  Altered sleeping 1 0  Tired, decreased energy 1 1  Change in appetite 0 0  Feeling bad or failure about yourself  0 0  Trouble concentrating 1 1  Moving slowly or fidgety/restless 1 1  Suicidal thoughts 1 0  PHQ-9 Score 7 5  Difficult doing work/chores - Somewhat difficult   GAD 7 : Generalized Anxiety Score 11/21/2020 11/18/2020  Nervous, Anxious, on Edge 1 1  Control/stop worrying 0 1  Worry too much - different things 1 1  Trouble relaxing 1 1  Restless 0 1  Easily annoyed or irritable 1 1  Afraid - awful might happen 0 0  Total GAD 7 Score 4 6  Anxiety Difficulty - Somewhat difficult        Assessment & Plan:   Problem List  Items Addressed This Visit   None Visit Diagnoses     Preoperative clearance    -  Primary   Relevant Orders   EKG 12-Lead   Hyperlipidemia, unspecified hyperlipidemia type       Rheumatoid arthritis, involving unspecified site, unspecified whether rheumatoid factor present ([place])       Malignant neoplasm of right breast in female, estrogen receptor negative, unspecified site of breast (HCC)       Vitamin D insufficiency          Preoperative clearance: -Discussed with patient most recent labs: CBC w/d stable, WBC count and platelets normal. CMP normal. -EKG obtained: NSR, rate 64 bpm, no acute ST-T wave changes. No prior EKG to compare. -BP and pulse wnl's.  -Patient is medically cleared for surgery. Will defer prophylaxis anticoagulant therapy to surgeon. -Recommend discussing with rheumatologist immunosuppressive therapy adjustment/s before surgery.  Rheumatoid arthritis: -Followed by [facility]. -On Simponi Aria, Plaquenil, Rheumatrex and Folvite.  Malignant neoplasm of right breast in female, estrogen receptor negative, unspecified site of breast: -Followed by Hematology/Oncology.  Hyperlipidemia: -Discussed most recent lipid panel, total cholesterol 264, triglycerides 92, HDL 121, LDL 128 -The ASCVD  Risk score (Arnett DK, et al., 2019) failed to calculate for the following reasons:   The valid HDL cholesterol range is 20 to 100 mg/dL -Discussed low fat diet. -Will continue to monitor.  Vitamin D insufficiency: -Discussed recent Vit D, 27.3. Patient takes Vitamin D supplement, unsure of correct dosage. Recommend to take 2000-4000 units. -Will continue to monitor.    No orders of the defined types were placed in this encounter.   Follow-up: Return in about 6 months (around 05/21/2021) for MCW and FBW (lipid panel, cmp, vit d).    Lorrene Reid, PA-C

## 2020-12-04 NOTE — Telephone Encounter (Signed)
PROLIA GIVEN 10/11/2020 NEXT INJECTION 04/11/2021

## 2020-12-18 NOTE — Progress Notes (Addendum)
COVID swab appointment: n/a  COVID Vaccine Completed: no Date COVID Vaccine completed: Has received booster: COVID vaccine manufacturer: [place]   Date of COVID positive in last 90 days: no  PCP - Lorrene Reid, PA Cardiologist - n/a  Medical clearance 11/21/20 by Lorrene Reid in Epic  Chest x-ray - n/a EKG - 11/21/20 req Stress Test - 2006 ECHO - n/a Cardiac Cath - N/A Pacemaker/ICD device last checked: N/A Spinal Cord Stimulator: N/A  Sleep Study - n/a CPAP -   Fasting Blood Sugar - n/a Checks Blood Sugar _____ times a day  Blood Thinner Instructions:  Aspirin Instructions: ASA 81, hold 7 days Last Dose:  Activity level: Can go up a flight of stairs and perform activities of daily living without stopping and without symptoms of chest pain or shortness of breath.    Anesthesia review:   Patient denies shortness of breath, fever, cough and chest pain at PAT appointment   Patient verbalized understanding of instructions that were given to them at the PAT appointment. Patient was also instructed that they will need to review over the PAT instructions again at home before surgery.

## 2020-12-19 ENCOUNTER — Encounter (HOSPITAL_COMMUNITY)
Admission: RE | Admit: 2020-12-19 | Discharge: 2020-12-19 | Disposition: A | Discharge status: Home / Self Care | Payer: Medicare Other | Source: Ambulatory Visit | Attending: Orthopedic Surgery | Admitting: Orthopedic Surgery

## 2020-12-19 ENCOUNTER — Encounter (HOSPITAL_COMMUNITY): Payer: Self-pay

## 2020-12-19 ENCOUNTER — Other Ambulatory Visit: Payer: Self-pay

## 2020-12-19 VITALS — BP 137/88 | HR 87 | Temp 97.7°F | Resp 16 | Ht 66.0 in | Wt 121.2 lb

## 2020-12-19 DIAGNOSIS — Z01818 Encounter for other preprocedural examination: Secondary | ICD-10-CM

## 2020-12-19 DIAGNOSIS — Z01812 Encounter for preprocedural laboratory examination: Secondary | ICD-10-CM | POA: Diagnosis present

## 2020-12-19 LAB — CBC
HCT: 45 % (ref 36.0–46.0)
Hemoglobin: 14.8 g/dL (ref 12.0–15.0)
MCH: 33.8 pg (ref 26.0–34.0)
MCHC: 32.9 g/dL (ref 30.0–36.0)
MCV: 102.7 fL — ABNORMAL HIGH (ref 80.0–100.0)
Platelets: 307 10*3/uL (ref 150–400)
RBC: 4.38 MIL/uL (ref 3.87–5.11)
RDW: 13.4 % (ref 11.5–15.5)
WBC: 7.8 10*3/uL (ref 4.0–10.5)
nRBC: 0 % (ref 0.0–0.2)

## 2020-12-19 LAB — TYPE AND SCREEN
ABO/RH(D): O POS
Antibody Screen: NEGATIVE

## 2020-12-19 LAB — SURGICAL PCR SCREEN
MRSA, PCR: NEGATIVE
Staphylococcus aureus: NEGATIVE

## 2020-12-19 NOTE — Patient Instructions (Addendum)
DUE TO COVID-19 ONLY ONE VISITOR IS ALLOWED TO COME WITH YOU AND STAY IN THE WAITING ROOM ONLY DURING PRE OP AND PROCEDURE.   **NO VISITORS ARE ALLOWED IN THE SHORT STAY AREA OR RECOVERY ROOM!!**       Your procedure is scheduled on: 12/31/20   Report to [facility] Main Entrance    Report to admitting at 7:30 AM   Call this number if you have problems the morning of surgery [phone]   Do not eat food :After Midnight.   May have liquids until 7:15 AM day of surgery  CLEAR LIQUID DIET  Foods Allowed                                                                     Foods Excluded  Water, Black Coffee and tea (no milk or creamer)           liquids that you cannot  Plain Jell-O in any flavor  (No red)                                    see through such as: Fruit ices (not with fruit pulp)                                            milk, soups, orange juice              Iced Popsicles (No red)                                                All solid food                                   Apple juices Sports drinks like Gatorade (No red) Lightly seasoned clear broth or consume(fat free) Sugar    The day of surgery:  Drink ONE (1) Pre-Surgery Clear Ensure by 7:15 am the morning of surgery. Drink in one sitting. Do not sip.  This drink was given to you during your hospital  pre-op appointment visit. Nothing else to drink after completing the  Pre-Surgery Clear Ensure.          If you have questions, please contact your surgeon's office.     Oral Hygiene is also important to reduce your risk of infection.                                    Remember - BRUSH YOUR TEETH THE MORNING OF SURGERY WITH YOUR REGULAR TOOTHPASTE   Do NOT smoke after Midnight   Take these medicines the morning of surgery with A SIP OF WATER: Tylenol, Zyrtec  You may not have any metal on your body including hair pins, jewelry, and body piercing             Do not  wear make-up, lotions, powders, perfumes, or deodorant  Do not wear nail polish including gel and S&S, artificial/acrylic nails, or any other type of covering on natural nails including finger and toenails. If you have artificial nails, gel coating, etc. that needs to be removed by a nail salon please have this removed prior to surgery or surgery may need to be canceled/ delayed if the surgeon/ anesthesia feels like they are unable to be safely monitored.   Do not shave  48 hours prior to surgery.    Do not bring valuables to the hospital. [place].    Patients discharged on the day of surgery will not be allowed to drive home.   Special Instructions: Bring a copy of your healthcare power of attorney and living will documents         the day of surgery if you haven't scanned them before.              Please read over the following fact sheets you were given: IF YOU HAVE QUESTIONS ABOUT YOUR PRE-OP INSTRUCTIONS PLEASE CALL [facility] - Preparing for Surgery Before surgery, you can play an important role.  Because skin is not sterile, your skin needs to be as free of germs as possible.  You can reduce the number of germs on your skin by washing with CHG (chlorahexidine gluconate) soap before surgery.  CHG is an antiseptic cleaner which kills germs and bonds with the skin to continue killing germs even after washing. Please DO NOT use if you have an allergy to CHG or antibacterial soaps.  If your skin becomes reddened/irritated stop using the CHG and inform your nurse when you arrive at Short Stay. Do not shave (including legs and underarms) for at least 48 hours prior to the first CHG shower.  You may shave your face/neck.  Please follow these instructions carefully:  1.  Shower with CHG Soap the night before surgery and the  morning of surgery.  2.  If you choose to wash your hair, wash your hair first as usual with your  normal  shampoo.  3.  After you shampoo, rinse your hair and body thoroughly to remove the shampoo.                             4.  Use CHG as you would any other liquid soap.  You can apply chg directly to the skin and wash.  Gently with a scrungie or clean washcloth.  5.  Apply the CHG Soap to your body ONLY FROM THE NECK DOWN.   Do   not use on face/ open                           Wound or open sores. Avoid contact with eyes, ears mouth and   genitals (private parts).                       Wash face,  Genitals (private parts) with your normal soap.             6.  Wash thoroughly, paying special attention to the area where your    surgery  will be performed.  7.  Thoroughly rinse your body with warm water from the neck down.  8.  DO NOT shower/wash with your normal soap after using and rinsing off the CHG Soap.                9.  Pat yourself dry with a clean towel.            10.  Wear clean pajamas.            11.  Place clean sheets on your bed the night of your first shower and do not  sleep with pets. Day of Surgery : Do not apply any lotions/deodorants the morning of surgery.  Please wear clean clothes to the hospital/surgery center.  FAILURE TO FOLLOW THESE INSTRUCTIONS MAY RESULT IN THE CANCELLATION OF YOUR SURGERY  PATIENT SIGNATURE_________________________________  NURSE SIGNATURE__________________________________  ________________________________________________________________________   Carla Lamb  An incentive spirometer is a tool that can help keep your lungs clear and active. This tool measures how well you are filling your lungs with each breath. Taking long deep breaths may help reverse or decrease the chance of developing breathing (pulmonary) problems (especially infection) following: A long period of time when you are unable to move or be active. BEFORE THE PROCEDURE  If the spirometer includes an indicator to show your best effort, your nurse or  respiratory therapist will set it to a desired goal. If possible, sit up straight or lean slightly forward. Try not to slouch. Hold the incentive spirometer in an upright position. INSTRUCTIONS FOR USE  Sit on the edge of your bed if possible, or sit up as far as you can in bed or on a chair. Hold the incentive spirometer in an upright position. Breathe out normally. Place the mouthpiece in your mouth and seal your lips tightly around it. Breathe in slowly and as deeply as possible, raising the piston or the ball toward the top of the column. Hold your breath for 3-5 seconds or for as long as possible. Allow the piston or ball to fall to the bottom of the column. Remove the mouthpiece from your mouth and breathe out normally. Rest for a few seconds and repeat Steps 1 through 7 at least 10 times every 1-2 hours when you are awake. Take your time and take a few normal breaths between deep breaths. The spirometer may include an indicator to show your best effort. Use the indicator as a goal to work toward during each repetition. After each set of 10 deep breaths, practice coughing to be sure your lungs are clear. If you have an incision (the cut made at the time of surgery), support your incision when coughing by placing a pillow or rolled up towels firmly against it. Once you are able to get out of bed, walk around indoors and cough well. You may stop using the incentive spirometer when instructed by your caregiver.  RISKS AND COMPLICATIONS Take your time so you do not get dizzy or light-headed. If you are in pain, you may need to take or ask for pain medication before doing incentive spirometry. It is harder to take a deep breath if you are having pain. AFTER USE Rest and breathe slowly and easily. It can be helpful to keep track of a log of your progress. Your caregiver can provide you with a simple table to help with this. If you are  using the spirometer at home, follow these  instructions: [place] IF:  You are having difficultly using the spirometer. You have trouble using the spirometer as often as instructed. Your pain medication is not giving enough relief while using the spirometer. You develop fever of 100.5 F (38.1 C) or higher. SEEK IMMEDIATE MEDICAL CARE IF:  You cough up bloody sputum that had not been present before. You develop fever of 102 F (38.9 C) or greater. You develop worsening pain at or near the incision site. MAKE SURE YOU:  Understand these instructions. Will watch your condition. Will get help right away if you are not doing well or get worse. Document Released: 05/18/2006 Document Revised: 03/30/2011 Document Reviewed: 07/19/2006 ExitCare Patient Information 2014 ExitCare, [place].   ________________________________________________________________________  WHAT IS A BLOOD TRANSFUSION? Blood Transfusion Information  A transfusion is the replacement of blood or some of its parts. Blood is made up of multiple cells which provide different functions. Red blood cells carry oxygen and are used for blood loss replacement. White blood cells fight against infection. Platelets control bleeding. Plasma helps clot blood. Other blood products are available for specialized needs, such as hemophilia or other clotting disorders. BEFORE THE TRANSFUSION  Who gives blood for transfusions?  Healthy volunteers who are fully evaluated to make sure their blood is safe. This is blood bank blood. Transfusion therapy is the safest it has ever been in the practice of medicine. Before blood is taken from a donor, a complete history is taken to make sure that person has no history of diseases nor engages in risky social behavior (examples are intravenous drug use or sexual activity with multiple partners). The donor's travel history is screened to minimize risk of transmitting infections, such as malaria. The donated blood is tested for signs of  infectious diseases, such as HIV and hepatitis. The blood is then tested to be sure it is compatible with you in order to minimize the chance of a transfusion reaction. If you or a relative donates blood, this is often done in anticipation of surgery and is not appropriate for emergency situations. It takes many days to process the donated blood. RISKS AND COMPLICATIONS Although transfusion therapy is very safe and saves many lives, the main dangers of transfusion include:  Getting an infectious disease. Developing a transfusion reaction. This is an allergic reaction to something in the blood you were given. Every precaution is taken to prevent this. The decision to have a blood transfusion has been considered carefully by your caregiver before blood is given. Blood is not given unless the benefits outweigh the risks. AFTER THE TRANSFUSION Right after receiving a blood transfusion, you will usually feel much better and more energetic. This is especially true if your red blood cells have gotten low (anemic). The transfusion raises the level of the red blood cells which carry oxygen, and this usually causes an energy increase. The nurse administering the transfusion will monitor you carefully for complications. HOME CARE INSTRUCTIONS  No special instructions are needed after a transfusion. You may find your energy is better. Speak with your caregiver about any limitations on activity for underlying diseases you may have. SEEK MEDICAL CARE IF:  Your condition is not improving after your transfusion. You develop redness or irritation at the intravenous (IV) site. SEEK IMMEDIATE MEDICAL CARE IF:  Any of the following symptoms occur over the next 12 hours: Shaking chills. You have a temperature by mouth above 102 F (38.9 C), not controlled by  medicine. Chest, back, or muscle pain. People around you feel you are not acting correctly or are confused. Shortness of breath or difficulty  breathing. Dizziness and fainting. You get a rash or develop hives. You have a decrease in urine output. Your urine turns a dark color or changes to pink, red, or brown. Any of the following symptoms occur over the next 10 days: You have a temperature by mouth above 102 F (38.9 C), not controlled by medicine. Shortness of breath. Weakness after normal activity. The white part of the eye turns yellow (jaundice). You have a decrease in the amount of urine or are urinating less often. Your urine turns a dark color or changes to pink, red, or brown. Document Released: 01/03/2000 Document Revised: 03/30/2011 Document Reviewed: 08/22/2007 [facility] Patient Information 2014 [place], [place].  _______________________________________________________________________

## 2020-12-27 NOTE — H&P (Signed)
HIP ARTHROPLASTY ADMISSION H&P  Patient ID: Carla Lamb MRN: [id number] DOB/AGE: 67/16/1957 67 y.o.  Chief Complaint: left hip pain.  Planned Procedure Date: 12/31/20 Medical and Cardiac Clearance by Lorrene Reid PA-C   Rheumatology clearance by Dr. [name]   HPI: Carla Lamb is a 65 y.o. female who presents for evaluation of OA LEFT HIP. The patient has a history of pain and functional disability in the left hip due to arthritis and has failed non-surgical conservative treatments for greater than 12 weeks to include NSAID's and/or analgesics, corticosteriod injections, supervised PT with diminished ADL's post treatment, and activity modification.  Onset of symptoms was gradual, starting 1 year ago with gradually worsening course since that time. The patient noted no past surgery on the left hip.  Patient currently rates pain at 8 out of 10 with activity. Patient has night pain, worsening of pain with activity and weight bearing, and pain that interferes with activities of daily living.  Patient has evidence of subchondral sclerosis, periarticular osteophytes, and joint space narrowing by imaging studies.  There is no active infection.  Past Medical History:  Diagnosis Date   Anxiety    Bursitis    Cancer ([place])    Right Mastectomy [facility] Disease   Depression    During treament   Osteoporosis 01/2017   2017 T score -2.5 2019 T score -2.3.   RA (rheumatoid arthritis) [facility])    Past Surgical History:  Procedure Laterality Date   ANTERIOR CERVICAL DECOMP/DISCECTOMY FUSION  08/25/2011   Procedure: ANTERIOR CERVICAL DECOMPRESSION/DISCECTOMY FUSION 3 LEVELS;  Surgeon: Floyce Stakes, MD;  Location: [facility];  Service: Neurosurgery;  Laterality: N/A;  Anterior Cervical Decompression/Discectomy Fusion Cervical Four-Five Cervical Six Corpectomy, Fusion to cervical Seven.   bunions     bilat feet   CERVICAL FUSION  08/25/2011   C 4  5 & 6   MASTECTOMY  2006   Right   Vaginal  Cyst Removed  2010   No Known Allergies Prior to Admission medications   Medication Sig Start Date End Date Taking? Authorizing Provider  acetaminophen (TYLENOL) 500 MG tablet Take 1,000-4,000 mg by mouth daily.   Yes [provider]  cetirizine (ZYRTEC) 10 MG tablet Take 10 mg by mouth daily.   Yes [provider]  cholecalciferol (VITAMIN D3) 25 MCG (1000 UNIT) tablet Take 2,000 Units by mouth daily.   Yes [provider]  Cyanocobalamin (VITAMIN B-12) 5000 MCG TBDP Take 5,000 mg by mouth daily.   Yes [provider]  folic acid (FOLVITE) 1 MG tablet Take 3 mg by mouth daily.   Yes [provider]  Golimumab ([place] ARIA IV) Inject 1 Dose into the vein every 8 (eight) weeks.   Yes [provider]  hydroxychloroquine (PLAQUENIL) 200 MG tablet Take 400 mg by mouth daily.   Yes [provider]  methotrexate (RHEUMATREX) 2.5 MG tablet Take 15 mg by mouth once a week. Monday.  Caution:Chemotherapy. Protect from light.   Yes [provider]   Social History   Socioeconomic History   Marital status: Married    Spouse name: Not on file   Number of children: Not on file   Years of education: Not on file   Highest education level: Not on file  Occupational History   Not on file  Tobacco Use   Smoking status: Every Day    Packs/day: 0.25    Years: 0.00    Pack years: 0.00  Types: Cigarettes    Last attempt to quit: 01/19/1978    Years since quitting: 42.9   Smokeless tobacco: Never  Vaping Use   Vaping Use: Never used  Substance and Sexual Activity   Alcohol use: Yes    Alcohol/week: 12.0 standard drinks    Types: 12 Cans of beer per week   Drug use: No   Sexual activity: Not Currently    Birth control/protection: Post-menopausal    Comment: 1st intercourse 65 yo-Fewer than 5 partners  Other Topics Concern   Not on file  Social History Narrative   Not on file   Social Determinants of Health   Financial  Resource Strain: Not on file  Food Insecurity: Not on file  Transportation Needs: Not on file  Physical Activity: Not on file  Stress: Not on file  Social Connections: Not on file   Family History  Problem Relation Age of Onset   Cancer Father        LUNG- SMOKER   Cancer Brother 67       RENAL   Breast cancer Mother 71   Hypertension Mother    Cancer Maternal Grandfather        Colon   Diverticulitis Sister     ROS: Currently denies lightheadedness, dizziness, Fever, chills, CP, SOB.   No personal history of DVT, PE, MI, or CVA. No loose teeth or dentures All other systems have been reviewed and were otherwise currently negative with the exception of those mentioned in the HPI and as above.  Objective: Vitals: Ht: 5'6" Wt: 124.8 lbs Temp: 97.9 BP: 162/86 Pulse: 88 O2 100% on room air.   Physical Exam: General: Alert, NAD. Trendelenberg Gait. Limping HEENT: EOMI, Good Neck Extension  Pulm: No increased work of breathing.  Clear B/L A/P w/o crackle or wheeze.  CV: RRR, No m/g/r appreciated  GI: soft, NT, ND. BS x 4 quadrants Neuro: CN II-XII grossly intact without focal deficit.  Sensation intact distally Skin: No lesions in the area of chief complaint MSK/Surgical Site: non- TTP. Hip pain with ROM. + Stinchfield. - SLR. - FABER. +FADIR. 5/5 strength.  NVI.    Imaging Review Plain radiographs demonstrate severe degenerative joint disease of the left hip.   The bone quality appears to be fair for age and reported activity level.  Preoperative templating of the joint replacement has been completed, documented, and submitted to the Operating Room personnel in order to optimize intra-operative equipment management.  Assessment: OA LEFT HIP Active Problems:   * No active hospital problems. *   Plan: Plan for Procedure(s): TOTAL HIP ARTHROPLASTY ANTERIOR APPROACH  The patient history, physical exam, clinical judgement of the provider and imaging are consistent with end  stage degenerative joint disease and total joint arthroplasty is deemed medically necessary. The treatment options including medical management, injection therapy, and arthroplasty were discussed at length. The risks and benefits of Procedure(s): TOTAL HIP ARTHROPLASTY ANTERIOR APPROACH were presented and reviewed.  The risks of nonoperative treatment, versus surgical intervention including but not limited to continued pain, aseptic loosening, stiffness, dislocation/subluxation, infection, bleeding, nerve injury, blood clots, cardiopulmonary complications, morbidity, mortality, among others were discussed. The patient verbalizes understanding and wishes to proceed with the plan.  Patient is being admitted for surgery, pain control, PT, prophylactic antibiotics, VTE prophylaxis, progressive ambulation, ADL's and discharge planning.   Dental prophylaxis discussed and recommended for 2 years postoperatively.  The patient does meet the criteria for TXA which will be used perioperatively.  ASA 81 mg BID will be used postoperatively for DVT prophylaxis in addition to SCDs, and early ambulation. Plan for Dilaudid for a few days, Celebrex, Tylenol for pain.  Negative past experiences with Oxycodone and Hydrocodone. Robaxin for muscle spasm.  Zofran for nausea and vomiting. Omeprazole for gastric protection. Pharmacy- [facility] on [address] The patient is planning to be discharged home with OPPT and into the care of her husband Marcello [place] who can be reached at [phone] Follow up appt 01/05/21 at 4:15pm     Alisa Graff Office [phone] 12/27/2020 2:36 PM

## 2020-12-31 ENCOUNTER — Encounter (HOSPITAL_COMMUNITY)
Admission: RE | Disposition: A | Discharge status: Home / Self Care | Payer: Self-pay | Source: Ambulatory Visit | Attending: Orthopedic Surgery

## 2020-12-31 ENCOUNTER — Ambulatory Visit (HOSPITAL_COMMUNITY): Payer: Medicare Other

## 2020-12-31 ENCOUNTER — Ambulatory Visit (HOSPITAL_COMMUNITY): Payer: Medicare Other | Admitting: Certified Registered Nurse Anesthetist

## 2020-12-31 ENCOUNTER — Ambulatory Visit (HOSPITAL_COMMUNITY)
Admission: RE | Admit: 2020-12-31 | Discharge: 2020-12-31 | Disposition: A | Discharge status: Home / Self Care | Payer: Medicare Other | Source: Ambulatory Visit | Attending: Orthopedic Surgery | Admitting: Orthopedic Surgery

## 2020-12-31 ENCOUNTER — Encounter (HOSPITAL_COMMUNITY): Payer: Self-pay | Admitting: Orthopedic Surgery

## 2020-12-31 DIAGNOSIS — Z96642 Presence of left artificial hip joint: Secondary | ICD-10-CM

## 2020-12-31 DIAGNOSIS — M069 Rheumatoid arthritis, unspecified: Secondary | ICD-10-CM | POA: Diagnosis not present

## 2020-12-31 DIAGNOSIS — F419 Anxiety disorder, unspecified: Secondary | ICD-10-CM | POA: Diagnosis not present

## 2020-12-31 DIAGNOSIS — F17210 Nicotine dependence, cigarettes, uncomplicated: Secondary | ICD-10-CM | POA: Insufficient documentation

## 2020-12-31 DIAGNOSIS — Z419 Encounter for procedure for purposes other than remedying health state, unspecified: Secondary | ICD-10-CM

## 2020-12-31 DIAGNOSIS — F32A Depression, unspecified: Secondary | ICD-10-CM | POA: Diagnosis not present

## 2020-12-31 DIAGNOSIS — M1612 Unilateral primary osteoarthritis, left hip: Secondary | ICD-10-CM | POA: Insufficient documentation

## 2020-12-31 HISTORY — PX: TOTAL HIP ARTHROPLASTY: SHX124

## 2020-12-31 LAB — ABO/RH: ABO/RH(D): O POS

## 2020-12-31 SURGERY — ARTHROPLASTY, HIP, TOTAL, ANTERIOR APPROACH
Anesthesia: Monitor Anesthesia Care | Site: Hip | Laterality: Left

## 2020-12-31 MED ORDER — LIDOCAINE HCL (CARDIAC) PF 100 MG/5ML IV SOSY
PREFILLED_SYRINGE | INTRAVENOUS | Status: DC | PRN
  Administered 2020-12-31: 20 mg via INTRATRACHEAL

## 2020-12-31 MED ORDER — OXYCODONE HCL 5 MG PO TABS: 5.0000 mg | ORAL_TABLET | Freq: Once | ORAL | Status: DC | PRN

## 2020-12-31 MED ORDER — LACTATED RINGERS IV BOLUS
250.0000 mL | Freq: Once | INTRAVENOUS | Status: AC
  Administered 2020-12-31: 250 mL via INTRAVENOUS

## 2020-12-31 MED ORDER — METHOCARBAMOL 500 MG IVPB - SIMPLE MED: 500.0000 mg | Freq: Four times a day (QID) | INTRAVENOUS | Status: DC | PRN

## 2020-12-31 MED ORDER — LACTATED RINGERS IV SOLN: INTRAVENOUS | Status: DC

## 2020-12-31 MED ORDER — PROPOFOL 10 MG/ML IV BOLUS
INTRAVENOUS | Status: DC | PRN
  Administered 2020-12-31: 30 mg via INTRAVENOUS

## 2020-12-31 MED ORDER — PHENYLEPHRINE HCL-NACL 20-0.9 MG/250ML-% IV SOLN
INTRAVENOUS | Status: AC
  Filled 2020-12-31: qty 250

## 2020-12-31 MED ORDER — ACETAMINOPHEN 500 MG PO TABS: 1000.0000 mg | ORAL_TABLET | Freq: Four times a day (QID) | ORAL | 0 refills | Status: AC | PRN

## 2020-12-31 MED ORDER — GLYCOPYRROLATE PF 0.2 MG/ML IJ SOSY
PREFILLED_SYRINGE | INTRAMUSCULAR | Status: DC | PRN
  Administered 2020-12-31: .1 mg via INTRAVENOUS

## 2020-12-31 MED ORDER — POVIDONE-IODINE 10 % EX SWAB: 2.0000 "application " | Freq: Once | CUTANEOUS | Status: DC

## 2020-12-31 MED ORDER — ONDANSETRON 4 MG PO TBDP: 4.0000 mg | ORAL_TABLET | Freq: Two times a day (BID) | ORAL | 0 refills | Status: DC | PRN

## 2020-12-31 MED ORDER — CHLORHEXIDINE GLUCONATE 0.12 % MT SOLN
15.0000 mL | Freq: Once | OROMUCOSAL | Status: AC
  Administered 2020-12-31: 15 mL via OROMUCOSAL

## 2020-12-31 MED ORDER — OXYCODONE HCL 5 MG/5ML PO SOLN: 5.0000 mg | Freq: Once | ORAL | Status: DC | PRN

## 2020-12-31 MED ORDER — DEXAMETHASONE SODIUM PHOSPHATE 10 MG/ML IJ SOLN
8.0000 mg | Freq: Once | INTRAMUSCULAR | Status: AC
  Administered 2020-12-31: 8 mg via INTRAVENOUS

## 2020-12-31 MED ORDER — LACTATED RINGERS IV BOLUS
500.0000 mL | Freq: Once | INTRAVENOUS | Status: AC
  Administered 2020-12-31: 500 mL via INTRAVENOUS

## 2020-12-31 MED ORDER — TRANEXAMIC ACID-NACL 1000-0.7 MG/100ML-% IV SOLN
1000.0000 mg | INTRAVENOUS | Status: AC
  Administered 2020-12-31: 1000 mg via INTRAVENOUS
  Filled 2020-12-31: qty 100

## 2020-12-31 MED ORDER — PHENYLEPHRINE 40 MCG/ML (10ML) SYRINGE FOR IV PUSH (FOR BLOOD PRESSURE SUPPORT)
PREFILLED_SYRINGE | INTRAVENOUS | Status: DC | PRN
  Administered 2020-12-31: 80 ug via INTRAVENOUS
  Administered 2020-12-31: 120 ug via INTRAVENOUS

## 2020-12-31 MED ORDER — TRANEXAMIC ACID-NACL 1000-0.7 MG/100ML-% IV SOLN
INTRAVENOUS | Status: AC
  Administered 2020-12-31: 1000 mg
  Filled 2020-12-31: qty 100

## 2020-12-31 MED ORDER — PHENYLEPHRINE HCL-NACL 20-0.9 MG/250ML-% IV SOLN
INTRAVENOUS | Status: DC | PRN
  Administered 2020-12-31: 50 ug/min via INTRAVENOUS

## 2020-12-31 MED ORDER — POVIDONE-IODINE 10 % EX SWAB
2.0000 "application " | Freq: Once | CUTANEOUS | Status: AC
  Administered 2020-12-31: 2 via TOPICAL

## 2020-12-31 MED ORDER — BUPIVACAINE LIPOSOME 1.3 % IJ SUSP
INTRAMUSCULAR | Status: DC | PRN
  Administered 2020-12-31: 10 mL

## 2020-12-31 MED ORDER — HYDROMORPHONE HCL 2 MG PO TABS: 2.0000 mg | ORAL_TABLET | ORAL | 0 refills | Status: DC | PRN

## 2020-12-31 MED ORDER — CEFAZOLIN SODIUM-DEXTROSE 1-4 GM/50ML-% IV SOLN
INTRAVENOUS | Status: AC
  Administered 2020-12-31: 1 g via INTRAVENOUS
  Filled 2020-12-31: qty 50

## 2020-12-31 MED ORDER — SODIUM CHLORIDE (PF) 0.9 % IJ SOLN
INTRAMUSCULAR | Status: AC
  Filled 2020-12-31: qty 10

## 2020-12-31 MED ORDER — PROMETHAZINE HCL 25 MG/ML IJ SOLN: 6.2500 mg | INTRAMUSCULAR | Status: DC | PRN

## 2020-12-31 MED ORDER — CELECOXIB 200 MG PO CAPS: 200.0000 mg | ORAL_CAPSULE | Freq: Two times a day (BID) | ORAL | 0 refills | Status: AC

## 2020-12-31 MED ORDER — METHOCARBAMOL 750 MG PO TABS: 750.0000 mg | ORAL_TABLET | Freq: Three times a day (TID) | ORAL | 0 refills | Status: DC | PRN

## 2020-12-31 MED ORDER — WATER FOR IRRIGATION, STERILE IR SOLN
Status: DC | PRN
  Administered 2020-12-31: 2000 mL

## 2020-12-31 MED ORDER — ONDANSETRON HCL 4 MG/2ML IJ SOLN
INTRAMUSCULAR | Status: DC | PRN
  Administered 2020-12-31: 4 mg via INTRAVENOUS

## 2020-12-31 MED ORDER — 0.9 % SODIUM CHLORIDE (POUR BTL) OPTIME
TOPICAL | Status: DC | PRN
  Administered 2020-12-31: 1000 mL

## 2020-12-31 MED ORDER — CEFAZOLIN SODIUM-DEXTROSE 1-4 GM/50ML-% IV SOLN: 1.0000 g | Freq: Four times a day (QID) | INTRAVENOUS | Status: DC

## 2020-12-31 MED ORDER — FENTANYL CITRATE PF 50 MCG/ML IJ SOSY: 25.0000 ug | PREFILLED_SYRINGE | INTRAMUSCULAR | Status: DC | PRN

## 2020-12-31 MED ORDER — BUPIVACAINE LIPOSOME 1.3 % IJ SUSP
INTRAMUSCULAR | Status: AC
  Filled 2020-12-31: qty 10

## 2020-12-31 MED ORDER — PROPOFOL 500 MG/50ML IV EMUL
INTRAVENOUS | Status: DC | PRN
  Administered 2020-12-31: 100 ug/kg/min via INTRAVENOUS

## 2020-12-31 MED ORDER — AMISULPRIDE (ANTIEMETIC) 5 MG/2ML IV SOLN: 10.0000 mg | Freq: Once | INTRAVENOUS | Status: DC | PRN

## 2020-12-31 MED ORDER — OMEPRAZOLE MAGNESIUM 20 MG PO TBEC: 20.0000 mg | DELAYED_RELEASE_TABLET | Freq: Every day | ORAL | 0 refills | Status: DC

## 2020-12-31 MED ORDER — BUPIVACAINE LIPOSOME 1.3 % IJ SUSP: 10.0000 mL | Freq: Once | INTRAMUSCULAR | Status: DC

## 2020-12-31 MED ORDER — SODIUM CHLORIDE FLUSH 0.9 % IV SOLN
INTRAVENOUS | Status: DC | PRN
  Administered 2020-12-31: 10 mL

## 2020-12-31 MED ORDER — ASPIRIN EC 81 MG PO TBEC: 81.0000 mg | DELAYED_RELEASE_TABLET | Freq: Two times a day (BID) | ORAL | 0 refills | Status: DC

## 2020-12-31 MED ORDER — ORAL CARE MOUTH RINSE: 15.0000 mL | Freq: Once | OROMUCOSAL | Status: AC

## 2020-12-31 MED ORDER — BUPIVACAINE IN DEXTROSE 0.75-8.25 % IT SOLN
INTRATHECAL | Status: DC | PRN
  Administered 2020-12-31: 1.6 mL via INTRATHECAL

## 2020-12-31 MED ORDER — ACETAMINOPHEN 500 MG PO TABS
1000.0000 mg | ORAL_TABLET | Freq: Once | ORAL | Status: AC
  Administered 2020-12-31: 1000 mg via ORAL
  Filled 2020-12-31: qty 2

## 2020-12-31 MED ORDER — CEFAZOLIN SODIUM-DEXTROSE 2-4 GM/100ML-% IV SOLN
2.0000 g | INTRAVENOUS | Status: AC
  Administered 2020-12-31: 2 g via INTRAVENOUS
  Filled 2020-12-31: qty 100

## 2020-12-31 MED ORDER — TRANEXAMIC ACID-NACL 1000-0.7 MG/100ML-% IV SOLN: 1000.0000 mg | Freq: Once | INTRAVENOUS | Status: DC

## 2020-12-31 MED ORDER — METHOCARBAMOL 500 MG PO TABS: 500.0000 mg | ORAL_TABLET | Freq: Four times a day (QID) | ORAL | Status: DC | PRN

## 2020-12-31 SURGICAL SUPPLY — 45 items
BAG COUNTER SPONGE SURGICOUNT (BAG) IMPLANT
BAG ZIPLOCK 12X15 (MISCELLANEOUS) IMPLANT
BLADE SAG 18X100X1.27 (BLADE) ×2 IMPLANT
BLADE SURG SZ10 CARB STEEL (BLADE) ×3 IMPLANT
CHLORAPREP W/TINT 26 (MISCELLANEOUS) ×2 IMPLANT
CLSR STERI-STRIP ANTIMIC 1/2X4 (GAUZE/BANDAGES/DRESSINGS) ×2 IMPLANT
COVER PERINEAL POST (MISCELLANEOUS) ×2 IMPLANT
COVER SURGICAL LIGHT HANDLE (MISCELLANEOUS) ×2 IMPLANT
DECANTER SPIKE VIAL GLASS SM (MISCELLANEOUS) ×4 IMPLANT
DRAPE IMP U-DRAPE 54X76 (DRAPES) ×2 IMPLANT
DRAPE STERI IOBAN 125X83 (DRAPES) ×2 IMPLANT
DRAPE U-SHAPE 47X51 STRL (DRAPES) ×4 IMPLANT
DRSG MEPILEX BORDER 4X12 (GAUZE/BANDAGES/DRESSINGS) ×1 IMPLANT
DRSG MEPILEX BORDER 4X8 (GAUZE/BANDAGES/DRESSINGS) ×2 IMPLANT
ELECT REM PT RETURN 15FT ADLT (MISCELLANEOUS) ×2 IMPLANT
GLOVE SRG 8 PF TXTR STRL LF DI (GLOVE) ×1 IMPLANT
GLOVE SURG ENC MOIS LTX SZ7.5 (GLOVE) ×2 IMPLANT
GLOVE SURG POLYISO LF SZ7.5 (GLOVE) ×2 IMPLANT
GLOVE SURG SYN 7.5  E (GLOVE) ×2
GLOVE SURG SYN 7.5 E (GLOVE) ×1 IMPLANT
GLOVE SURG SYN 7.5 PF PI (GLOVE) ×1 IMPLANT
GLOVE SURG UNDER POLY LF SZ7.5 (GLOVE) ×2 IMPLANT
GLOVE SURG UNDER POLY LF SZ8 (GLOVE) ×2
GOWN STRL REUS W/TWL LRG LVL3 (GOWN DISPOSABLE) ×2 IMPLANT
GOWN STRL REUS W/TWL XL LVL3 (GOWN DISPOSABLE) ×2 IMPLANT
HEAD BIOLOX HIP 36/-5 (Joint) IMPLANT
HIP BIOLOX HD 36/-5 (Joint) ×2 IMPLANT
HOLDER FOLEY CATH W/STRAP (MISCELLANEOUS) IMPLANT
INSERT 0 DEGREE 36 (Miscellaneous) ×1 IMPLANT
KIT TURNOVER KIT A (KITS) IMPLANT
MANIFOLD NEPTUNE II (INSTRUMENTS) ×2 IMPLANT
NS IRRIG 1000ML POUR BTL (IV SOLUTION) ×2 IMPLANT
PACK ANTERIOR HIP CUSTOM (KITS) ×2 IMPLANT
PROTECTOR NERVE ULNAR (MISCELLANEOUS) ×2 IMPLANT
SCREW HEX LP 6.5X20 (Screw) ×1 IMPLANT
SHELL ACETAB TRIDENT 48 (Shell) ×1 IMPLANT
STEM HIP 127 DEG (Stem) ×1 IMPLANT
SUT MNCRL AB 3-0 PS2 18 (SUTURE) ×2 IMPLANT
SUT VIC AB 0 CT1 36 (SUTURE) ×2 IMPLANT
SUT VIC AB 1 CT1 36 (SUTURE) ×2 IMPLANT
SUT VIC AB 2-0 CT1 27 (SUTURE) ×4
SUT VIC AB 2-0 CT1 TAPERPNT 27 (SUTURE) ×2 IMPLANT
TRAY FOLEY MTR SLVR 16FR STAT (SET/KITS/TRAYS/PACK) IMPLANT
TUBE SUCTION HIGH CAP CLEAR NV (SUCTIONS) ×2 IMPLANT
WATER STERILE IRR 1000ML POUR (IV SOLUTION) ×4 IMPLANT

## 2020-12-31 NOTE — Anesthesia Procedure Notes (Signed)
Spinal  Patient location during procedure: OR Start time: 12/31/2020 10:01 AM End time: 12/31/2020 10:07 AM Reason for block: surgical anesthesia Staffing Performed: anesthesiologist  Anesthesiologist: Pervis Hocking, DO Preanesthetic Checklist Completed: patient identified, IV checked, risks and benefits discussed, surgical consent, monitors and equipment checked, pre-op evaluation and timeout performed Spinal Block Patient position: sitting Prep: DuraPrep and site prepped and draped Patient monitoring: cardiac monitor, continuous pulse ox and blood pressure Approach: midline Location: L3-4 Injection technique: single-shot Needle Needle type: Pencan  Needle gauge: 24 G Needle length: 9 cm Assessment Sensory level: T6 Events: CSF return and second provider Additional Notes Functioning IV was confirmed and monitors were applied. Sterile prep and drape, including hand hygiene and sterile gloves were used. The patient was positioned and the spine was prepped. The skin was anesthetized with lidocaine.  Free flow of clear CSF was obtained prior to injecting local anesthetic into the CSF.  The spinal needle aspirated freely following injection.  The needle was carefully withdrawn.  The patient tolerated the procedure well.

## 2020-12-31 NOTE — Anesthesia Postprocedure Evaluation (Signed)
Anesthesia Post Note  Patient: Carla Lamb  Procedure(s) Performed: TOTAL HIP ARTHROPLASTY ANTERIOR APPROACH (Left: Hip)     Patient location during evaluation: PACU Anesthesia Type: MAC and Spinal Level of consciousness: awake and alert and oriented Pain management: pain level controlled Vital Signs Assessment: post-procedure vital signs reviewed and stable Respiratory status: spontaneous breathing, nonlabored ventilation and respiratory function stable Cardiovascular status: blood pressure returned to baseline and stable Postop Assessment: no headache, no backache, spinal receding and patient able to bend at knees Anesthetic complications: no   No notable events documented.  Last Vitals:  Vitals:   12/31/20 1200 12/31/20 1215  BP:  123/79  Pulse: 68 70  Resp: 12 19  Temp:    SpO2: 100% 100%    Last Pain:  Vitals:   12/31/20 1215  TempSrc:   PainSc: 0-No pain    LLE Motor Response: Purposeful movement (12/31/20 1215) LLE Sensation: Numbness;Increased (12/31/20 1215) RLE Motor Response: Purposeful movement (12/31/20 1215) RLE Sensation: Increased;Numbness (12/31/20 1215)      Pervis Hocking

## 2020-12-31 NOTE — Anesthesia Preprocedure Evaluation (Addendum)
Anesthesia Evaluation  Patient identified by MRN, date of birth, ID band Patient awake    Reviewed: Allergy & Precautions, NPO status , Patient's Chart, lab work & pertinent test results  Airway Mallampati: II  TM Distance: >3 FB Neck ROM: Full    Dental no notable dental hx.    Pulmonary Current Smoker and Patient abstained from smoking.,    Pulmonary exam normal breath sounds clear to auscultation       Cardiovascular Exercise Tolerance: Good negative cardio ROS Normal cardiovascular exam Rhythm:Regular Rate:Normal     Neuro/Psych PSYCHIATRIC DISORDERS Anxiety Depression    GI/Hepatic negative GI ROS, Neg liver ROS,   Endo/Other  negative endocrine ROS  Renal/GU negative Renal ROS  negative genitourinary   Musculoskeletal  (+) Arthritis , Rheumatoid disorders,    Abdominal   Peds negative pediatric ROS (+)  Hematology negative hematology ROS (+)   Anesthesia Other Findings   Reproductive/Obstetrics                             Anesthesia Physical Anesthesia Plan  ASA: 2  Anesthesia Plan: Spinal and MAC   Post-op Pain Management:    Induction: Intravenous  PONV Risk Score and Plan: 1 and Treatment may vary due to age or medical condition, Ondansetron, Dexamethasone and Midazolam  Airway Management Planned:   Additional Equipment: None  Intra-op Plan:   Post-operative Plan: Extubation in OR  Informed Consent: I have reviewed the patients History and Physical, chart, labs and discussed the procedure including the risks, benefits and alternatives for the proposed anesthesia with the patient or authorized representative who has indicated his/her understanding and acceptance.     Dental advisory given  Plan Discussed with: CRNA, Anesthesiologist and Surgeon  Anesthesia Plan Comments:         Anesthesia Quick Evaluation

## 2020-12-31 NOTE — Interval H&P Note (Signed)
History and Physical Interval Note:  12/31/2020 8:46 AM  Carla Lamb  has presented today for surgery, with the diagnosis of OA LEFT HIP.  The various methods of treatment have been discussed with the patient and family. After consideration of risks, benefits and other options for treatment, the patient has consented to  Procedure(s): TOTAL HIP ARTHROPLASTY ANTERIOR APPROACH (Left) as a surgical intervention.  The patient's history has been reviewed, patient examined, no change in status, stable for surgery.  I have reviewed the patient's chart and labs.  Questions were answered to the patient's satisfaction.     Renette Butters

## 2020-12-31 NOTE — Op Note (Signed)
12/31/2020  11:30 AM  PATIENT:  Carla Lamb   MRN: [id number]  PRE-OPERATIVE DIAGNOSIS:  OA LEFT HIP  POST-OPERATIVE DIAGNOSIS:  OA LEFT HIP  PROCEDURE:  Procedure(s): TOTAL HIP ARTHROPLASTY ANTERIOR APPROACH  PREOPERATIVE INDICATIONS:    Carla Lamb is an 65 y.o. female who has a diagnosis of <principal problem not specified> and elected for surgical management after failing conservative treatment.  The risks benefits and alternatives were discussed with the patient including but not limited to the risks of nonoperative treatment, versus surgical intervention including infection, bleeding, nerve injury, periprosthetic fracture, the need for revision surgery, dislocation, leg length discrepancy, blood clots, cardiopulmonary complications, morbidity, mortality, among others, and they were willing to proceed.     OPERATIVE REPORT     SURGEON:   Renette Butters, MD    ASSISTANT:  Aggie Moats, PA-C, he was present and scrubbed throughout the case, critical for completion in a timely fashion, and for retraction, instrumentation, and closure.     ANESTHESIA:  General    COMPLICATIONS:  None.     COMPONENTS:  Stryker acolade fit femur size 5 with a 36 mm -5 head ball and an acetabular shell size 48 with a  polyethylene liner    PROCEDURE IN DETAIL:   The patient was met in the holding area and  identified.  The appropriate hip was identified and marked at the operative site.  The patient was then transported to the OR  and  placed under anesthesia per that record.  At that point, the patient was  placed in the supine position and  secured to the operating room table and all bony prominences padded. He received pre-operative antibiotics    The operative lower extremity was prepped from the iliac crest to the distal leg.  Sterile draping was performed.  Time out was performed prior to incision.      Skin incision was made just 2 cm lateral to the ASIS  extending in line with the  tensor fascia lata. Electrocautery was used to control all bleeders. I dissected down sharply to the fascia of the tensor fascia lata was confirmed that the muscle fibers beneath were running posteriorly. I then incised the fascia over the superficial tensor fascia lata in line with the incision. The fascia was elevated off the anterior aspect of the muscle the muscle was retracted posteriorly and protected throughout the case. I then used electrocautery to incise the tensor fascia lata fascia control and all bleeders. Immediately visible was the fat over top of the anterior neck and capsule.  I removed the anterior fat from the capsule and elevated the rectus muscle off of the anterior capsule. I then removed a large time of capsule. The retractors were then placed over the anterior acetabulum as well as around the superior and inferior neck.  I then made a femoral neck cut. Then used the power corkscrew to remove the femoral head from the acetabulum and thoroughly irrigated the acetabulum. I sized the femoral head.    I then exposed the deep acetabulum, cleared out any tissue including the ligamentum teres.   After adequate visualization, I excised the labrum, and then sequentially reamed.  I then impacted the acetabular implant into place using fluoroscopy for guidance.  Appropriate version and inclination was confirmed clinically matching their bony anatomy, and with fluoroscopy.  I placed a 20 mm screw in the posterior/superio position with an excellent bite.    I then placed the polyethylene liner in  place  I then adducted the leg and released the external rotators from the posterior femur allowing it to be easily delivered up lateral and anterior to the acetabulum for preparation of the femoral canal.    I then prepared the proximal femur using the cookie-cutter and then sequentially reamed and broached.  A trial broach, neck, and head was utilized, and I reduced the hip and used floroscopy to  assess the neck length and femoral implant.  I then impacted the femoral prosthesis into place into the appropriate version. The hip was then reduced and fluoroscopy confirmed appropriate position. Leg lengths were restored.  I then irrigated the hip copiously again with, and repaired the fascia with Vicryl, followed by monocryl for the subcutaneous tissue, Monocryl for the skin, Steri-Strips and sterile gauze. The patient was then awakened and returned to PACU in stable and satisfactory condition. There were no complications.  POST OPERATIVE PLAN: WBAT, DVT px: SCD's/TED, ambulation and chemical dvt px  Carla Lynch, MD Orthopedic Surgeon [phone]

## 2020-12-31 NOTE — Discharge Instructions (Addendum)

## 2020-12-31 NOTE — Evaluation (Signed)
Physical Therapy Evaluation Patient Details Name: Carla Lamb MRN: [id number] DOB: 10/11/1955 Today's Date: 12/31/2020  History of Present Illness  65 yo female s/p L THA DA 12/31/20. Hx of RA, ACDF 2013  Clinical Impression  On eval in PACU, POD 0, pt was Supv-Min guard assist for mobility. She walked ~125 feet with a RW. Minimal pain with activity. Pt denied dizziness. Reviewed/practiced exercises, gait training, and stair training. Pt reports she has a HEP already at home. No further questions/concerns from pt. All education completed.        Recommendations for follow up therapy are one component of a multi-disciplinary discharge planning process, led by the attending physician.  Recommendations may be updated based on patient status, additional functional criteria and insurance authorization.  Follow Up Recommendations Follow physician's recommendations for discharge plan and follow up therapies (plan is for OP PT)    Assistance Recommended at Discharge Intermittent Supervision/Assistance  Functional Status Assessment Patient has had a recent decline in their functional status and demonstrates the ability to make significant improvements in function in a reasonable and predictable amount of time.  Equipment Recommendations  None recommended by PT    Recommendations for Other Services       Precautions / Restrictions Restrictions Weight Bearing Restrictions: No Other Position/Activity Restrictions: WBAT      Mobility  Bed Mobility Overal bed mobility: Needs Assistance Bed Mobility: Supine to Sit     Supine to sit: Supervision;HOB elevated     General bed mobility comments: Supv for safety.    Transfers Overall transfer level: Needs assistance Equipment used: Rolling walker (2 wheels) Transfers: Sit to/from Stand Sit to Stand: Supervision           General transfer comment: Supv for safety. Cues for hand placement    Ambulation/Gait Ambulation/Gait  assistance: Min guard Gait Distance (Feet): 125 Feet Assistive device: Rolling walker (2 wheels) Gait Pattern/deviations: Decreased stride length;Step-through pattern       General Gait Details: Min guard for safety. Cues for safety, sequencing initially but pt quickly progressed to step thru pattern. No dizziness.  Stairs Stairs: Yes Stairs assistance: Min guard Stair Management: Step to pattern;Forwards;Two rails Number of Stairs: 2 General stair comments: up and over portable stairs x 1. cues for safety, technique, sequence. min guard for safety  Wheelchair Mobility    Modified Rankin (Stroke Patients Only)       Balance Overall balance assessment: Needs assistance         Standing balance support: Bilateral upper extremity supported;Reliant on assistive device for balance Standing balance-Leahy Scale: Fair                               Pertinent Vitals/Pain Pain Assessment: 0-10 Pain Score: 2  Pain Location: L hip Pain Descriptors / Indicators: Discomfort;Sore Pain Intervention(s): Monitored during session;Repositioned    Home Living Family/patient expects to be discharged to:: Private residence Living Arrangements: Spouse/significant other Available Help at Discharge: Family Type of Home: House Home Access: Stairs to enter Entrance Stairs-Rails: Psychiatric nurse of Steps: 4   Home Layout: Laundry or work area in [place]: Conservation officer, nature (2 wheels);Shower seat;BSC/3in1      Prior Function Prior Level of Function : Independent/Modified Independent                     Hand Dominance        Extremity/Trunk Assessment  Upper Extremity Assessment Upper Extremity Assessment: Overall WFL for tasks assessed    Lower Extremity Assessment Lower Extremity Assessment: Generalized weakness    Cervical / Trunk Assessment Cervical / Trunk Assessment: Normal  Communication   Communication: No  difficulties  Cognition Arousal/Alertness: Awake/alert Behavior During Therapy: WFL for tasks assessed/performed Overall Cognitive Status: Within Functional Limits for tasks assessed                                          General Comments      Exercises Total Joint Exercises Ankle Circles/Pumps: AROM;Both;10 reps Quad Sets: AROM;Both;10 reps Heel Slides: Left;AROM;10 reps Hip ABduction/ADduction: AROM;Left;10 reps   Assessment/Plan    PT Assessment All further PT needs can be met in the next venue of care (OP PT)  PT Problem List Decreased strength;Decreased mobility;Decreased range of motion;Decreased activity tolerance;Decreased balance;Decreased knowledge of use of DME;Pain       PT Treatment Interventions DME instruction;Therapeutic activities;Gait training;Therapeutic exercise;Functional mobility training;Stair training;Patient/family education    PT Goals (Current goals can be found in the Care Plan section)  Acute Rehab PT Goals Patient Stated Goal: regain ind/plof PT Goal Formulation: All assessment and education complete, DC therapy    Frequency 7X/week   Barriers to discharge        Co-evaluation               AM-PAC PT "6 Clicks" Mobility  Outcome Measure Help needed turning from your back to your side while in a flat bed without using bedrails?: A Little Help needed moving from lying on your back to sitting on the side of a flat bed without using bedrails?: A Little Help needed moving to and from a bed to a chair (including a wheelchair)?: A Little Help needed standing up from a chair using your arms (e.g., wheelchair or bedside chair)?: A Little Help needed to walk in hospital room?: A Little Help needed climbing 3-5 steps with a railing? : A Little 6 Click Score: 18    End of Session Equipment Utilized During Treatment: Gait belt Activity Tolerance: Patient tolerated treatment well Patient left: in chair;with call bell/phone  within reach   PT Visit Diagnosis: Other abnormalities of gait and mobility (R26.89);Pain Pain - Right/Left: Left Pain - part of body: Hip    Time: 7619-5093 PT Time Calculation (min) (ACUTE ONLY): 21 min   Charges:   PT Evaluation $PT Eval Low Complexity: [place], PT Acute Rehabilitation  Office: [phone] Pager: [phone]

## 2020-12-31 NOTE — Transfer of Care (Signed)
Immediate Anesthesia Transfer of Care Note  Patient: Carla Lamb  Procedure(s) Performed: TOTAL HIP ARTHROPLASTY ANTERIOR APPROACH (Left: Hip)  Patient Location: PACU  Anesthesia Type:MAC and Spinal  Level of Consciousness: awake, alert  and oriented  Airway & Oxygen Therapy: Patient Spontanous Breathing and Patient connected to nasal cannula oxygen  Post-op Assessment: Report given to RN and Post -op Vital signs reviewed and stable  Post vital signs: Reviewed and stable  Last Vitals:  Vitals Value Taken Time  BP 107/66 12/31/20 1152  Temp 36.4 C 12/31/20 1147  Pulse 68 12/31/20 1200  Resp 12 12/31/20 1200  SpO2 100 % 12/31/20 1200  Vitals shown include unvalidated device data.  Last Pain:  Vitals:   12/31/20 1147  TempSrc:   PainSc: 0-No pain         Complications: No notable events documented.

## 2021-01-02 ENCOUNTER — Encounter (HOSPITAL_COMMUNITY): Payer: Self-pay | Admitting: Orthopedic Surgery

## 2021-01-02 LAB — EKG 12-LEAD

## 2021-02-19 ENCOUNTER — Ambulatory Visit: Payer: Medicare Other | Admitting: Obstetrics & Gynecology

## 2021-03-05 ENCOUNTER — Encounter: Payer: Self-pay | Admitting: Obstetrics & Gynecology

## 2021-03-05 ENCOUNTER — Ambulatory Visit (INDEPENDENT_AMBULATORY_CARE_PROVIDER_SITE_OTHER): Payer: Medicare Other | Admitting: Obstetrics & Gynecology

## 2021-03-05 ENCOUNTER — Other Ambulatory Visit: Payer: Self-pay

## 2021-03-05 VITALS — BP 124/78 | HR 86 | Resp 16 | Ht 64.75 in | Wt 123.0 lb

## 2021-03-05 DIAGNOSIS — Z853 Personal history of malignant neoplasm of breast: Secondary | ICD-10-CM

## 2021-03-05 DIAGNOSIS — Z01419 Encounter for gynecological examination (general) (routine) without abnormal findings: Secondary | ICD-10-CM

## 2021-03-05 DIAGNOSIS — Z9289 Personal history of other medical treatment: Secondary | ICD-10-CM

## 2021-03-05 DIAGNOSIS — Z9189 Other specified personal risk factors, not elsewhere classified: Secondary | ICD-10-CM | POA: Diagnosis not present

## 2021-03-05 DIAGNOSIS — M810 Age-related osteoporosis without current pathological fracture: Secondary | ICD-10-CM

## 2021-03-05 DIAGNOSIS — Z780 Asymptomatic menopausal state: Secondary | ICD-10-CM

## 2021-03-05 NOTE — Progress Notes (Signed)
Carla Lamb 1955/03/26 [id number]   History:    66 y.o. G5P1A4L1  RP:  Established patient presenting for annual gyn exam   HPI: Postmenopause, well on no HRT.  No PMB.  No pelvic pain.  Pap 01/2020 Neg.  H/O Rt Breast Ca, s/p Rt Mastectomy.  Left Mammo Neg in 09/2020.  BMD Osteoporosis in 04/2020.  First Prolia in 09/2020, next dose to schedule in 03/2021. BMI 20.63. Colono to schedule this year.  Had a Lt hip replacement in 12/2020.  Health labs with Fam MD.   Past medical history,surgical history, family history and social history were all reviewed and documented in the EPIC chart.  Gynecologic History No LMP recorded. Patient is postmenopausal.  Obstetric History OB History  Gravida Para Term Preterm AB Living  5 1     4 1   SAB IAB Ectopic Multiple Live Births  4            # Outcome Date GA Lbr Len/2nd Weight Sex Delivery Anes PTL Lv  5 SAB           4 SAB           3 SAB           2 SAB           1 Para              ROS: A ROS was performed and pertinent positives and negatives are included in the history.  GENERAL: No fevers or chills. HEENT: No change in vision, no earache, sore throat or sinus congestion. NECK: No pain or stiffness. CARDIOVASCULAR: No chest pain or pressure. No palpitations. PULMONARY: No shortness of breath, cough or wheeze. GASTROINTESTINAL: No abdominal pain, nausea, vomiting or diarrhea, melena or bright red blood per rectum. GENITOURINARY: No urinary frequency, urgency, hesitancy or dysuria. MUSCULOSKELETAL: No joint or muscle pain, no back pain, no recent trauma. DERMATOLOGIC: No rash, no itching, no lesions. ENDOCRINE: No polyuria, polydipsia, no heat or cold intolerance. No recent change in weight. HEMATOLOGICAL: No anemia or easy bruising or bleeding. NEUROLOGIC: No headache, seizures, numbness, tingling or weakness. PSYCHIATRIC: No depression, no loss of interest in normal activity or change in sleep pattern.     Exam:   BP 124/78    Pulse 86     Resp 16    Ht 5' 4.75" (1.645 m)    Wt 123 lb (55.8 kg)    BMI 20.63 kg/m   Body mass index is 20.63 kg/m.  General appearance : Well developed well nourished female. No acute distress HEENT: Eyes: no retinal hemorrhage or exudates,  Neck supple, trachea midline, no carotid bruits, no thyroidmegaly Lungs: Clear to auscultation, no rhonchi or wheezes, or rib retractions  Heart: Regular rate and rhythm, no murmurs or gallops Breast:Examined in sitting and supine position were symmetrical in appearance, no palpable masses or tenderness,  no skin retraction, no nipple inversion, no nipple discharge, no skin discoloration, no axillary or supraclavicular lymphadenopathy Abdomen: no palpable masses or tenderness, no rebound or guarding Extremities: no edema or skin discoloration or tenderness  Pelvic: Vulva: Normal             Vagina: No gross lesions or discharge  Cervix: No gross lesions or discharge  Uterus  AV, normal size, shape and consistency, non-tender and mobile  Adnexa  Without masses or tenderness  Anus: Normal   Assessment/Plan:  66 y.o. female for annual exam   1. Well female  exam with routine gynecological exam Postmenopause, well on no HRT.  No PMB.  No pelvic pain.  Pap 01/2020 Neg.  H/O Rt Breast Ca, s/p Rt Mastectomy.  Left Mammo Neg in 09/2020.  BMD Osteoporosis in 04/2020.  First Prolia in 09/2020, next dose to schedule in 03/2021. BMI 20.63. Colono to schedule this year.  Had a Lt hip replacement in 12/2020.  Health labs with Fam MD.  2. At risk of fracture due to osteoporosis  3. Postmenopause Postmenopause, well on no HRT.  No PMB.  No pelvic pain.   4. Age-related osteoporosis without current pathological fracture Osteoporosis on BD 04/2020.  Started on Prolia, had her fist dose in 09/2020.  Will schedule Prolia for 03/2021.  5. Personal history of breast cancer S/P Rt Mastectomy.  Left Mammo 09/2020 Neg.  6. Personal history of other medical treatment   Princess Bruins MD, 3:15 PM 03/05/2021

## 2021-04-09 ENCOUNTER — Telehealth: Payer: Self-pay | Admitting: *Deleted

## 2021-04-09 NOTE — Telephone Encounter (Addendum)
Deductible $226 ($226 MET) ? ?OOP MAX N/A ? ?Annual exam 03/05/2021 ? ?Calcium  9.6           Date 11/18/2020 ? ?Upcoming dental procedures NO ? ?Prior Authorization needed NO ? ?Pt estimated Cost $0 ? ?APPT 04/10/2021 ? ? ? ? ?Coverage Details:For the Secondary MD Purchase access option, this plan is a Medicare ?Supplement Plan G and it covers the Medicare Part B co-insurance and 100% of the excess charges. This ?plan does not cover the Medicare Part B deductible ?

## 2021-04-10 ENCOUNTER — Other Ambulatory Visit: Payer: Self-pay

## 2021-04-10 ENCOUNTER — Ambulatory Visit (INDEPENDENT_AMBULATORY_CARE_PROVIDER_SITE_OTHER): Payer: Medicare Other

## 2021-04-10 DIAGNOSIS — M810 Age-related osteoporosis without current pathological fracture: Secondary | ICD-10-CM | POA: Diagnosis not present

## 2021-04-10 MED ORDER — DENOSUMAB 60 MG/ML ~~LOC~~ SOSY
60.0000 mg | PREFILLED_SYRINGE | Freq: Once | SUBCUTANEOUS | Status: AC
Start: 2021-04-10 — End: 2021-04-10
  Administered 2021-04-10: 60 mg via SUBCUTANEOUS

## 2021-05-15 NOTE — Patient Instructions (Signed)
Preventive Care 7 Years and Older, Female ?Preventive care refers to lifestyle choices and visits with your health care provider that can promote health and wellness. Preventive care visits are also called wellness exams. ?What can I expect for my preventive care visit? ?Counseling ?Your health care provider may ask you questions about your: ?Medical history, including: ?Past medical problems. ?Family medical history. ?Pregnancy and menstrual history. ?History of falls. ?Current health, including: ?Memory and ability to understand (cognition). ?Emotional well-being. ?Home life and relationship well-being. ?Sexual activity and sexual health. ?Lifestyle, including: ?Alcohol, nicotine or tobacco, and drug use. ?Access to firearms. ?Diet, exercise, and sleep habits. ?Work and work Statistician. ?Sunscreen use. ?Safety issues such as seatbelt and bike helmet use. ?Physical exam ?Your health care provider will check your: ?Height and weight. These may be used to calculate your BMI (body mass index). BMI is a measurement that tells if you are at a healthy weight. ?Waist circumference. This measures the distance around your waistline. This measurement also tells if you are at a healthy weight and may help predict your risk of certain diseases, such as type 2 diabetes and high blood pressure. ?Heart rate and blood pressure. ?Body temperature. ?Skin for abnormal spots. ?What immunizations do I need? ? ?Vaccines are usually given at various ages, according to a schedule. Your health care provider will recommend vaccines for you based on your age, medical history, and lifestyle or other factors, such as travel or where you work. ?What tests do I need? ?Screening ?Your health care provider may recommend screening tests for certain conditions. This may include: ?Lipid and cholesterol levels. ?Hepatitis C test. ?Hepatitis B test. ?HIV (human immunodeficiency virus) test. ?STI (sexually transmitted infection) testing, if you are at  risk. ?Lung cancer screening. ?Colorectal cancer screening. ?Diabetes screening. This is done by checking your blood sugar (glucose) after you have not eaten for a while (fasting). ?Mammogram. Talk with your health care provider about how often you should have regular mammograms. ?BRCA-related cancer screening. This may be done if you have a family history of breast, ovarian, tubal, or peritoneal cancers. ?Bone density scan. This is done to screen for osteoporosis. ?Talk with your health care provider about your test results, treatment options, and if necessary, the need for more tests. ?Follow these instructions at home: ?Eating and drinking ? ?Eat a diet that includes fresh fruits and vegetables, whole grains, lean protein, and low-fat dairy products. Limit your intake of foods with high amounts of sugar, saturated fats, and salt. ?Take vitamin and mineral supplements as recommended by your health care provider. ?Do not drink alcohol if your health care provider tells you not to drink. ?If you drink alcohol: ?Limit how much you have to 0-1 drink a day. ?Know how much alcohol is in your drink. In the [place] one drink equals one 12 oz bottle of beer (355 mL), one 5 oz glass of wine (148 mL), or one 1? oz glass of hard liquor (44 mL). ?Lifestyle ?Brush your teeth every morning and night with fluoride toothpaste. Floss one time each day. ?Exercise for at least 30 minutes 5 or more days each week. ?Do not use any products that contain nicotine or tobacco. These products include cigarettes, chewing tobacco, and vaping devices, such as e-cigarettes. If you need help quitting, ask your health care provider. ?Do not use drugs. ?If you are sexually active, practice safe sex. Use a condom or other form of protection in order to prevent STIs. ?Take aspirin only as told by  your health care provider. Make sure that you understand how much to take and what form to take. Work with your health care provider to find out whether it  is safe and beneficial for you to take aspirin daily. ?Ask your health care provider if you need to take a cholesterol-lowering medicine (statin). ?Find healthy ways to manage stress, such as: ?Meditation, yoga, or listening to music. ?Journaling. ?Talking to a trusted person. ?Spending time with friends and family. ?Minimize exposure to UV radiation to reduce your risk of skin cancer. ?Safety ?Always wear your seat belt while driving or riding in a vehicle. ?Do not drive: ?If you have been drinking alcohol. Do not ride with someone who has been drinking. ?When you are tired or distracted. ?While texting. ?If you have been using any mind-altering substances or drugs. ?Wear a helmet and other protective equipment during sports activities. ?If you have firearms in your house, make sure you follow all gun safety procedures. ?What's next? ?Visit your health care provider once a year for an annual wellness visit. ?Ask your health care provider how often you should have your eyes and teeth checked. ?Stay up to date on all vaccines. ?This information is not intended to replace advice given to you by your health care provider. Make sure you discuss any questions you have with your health care provider. ?Document Revised: 07/03/2020 Document Reviewed: 07/03/2020 ?Elsevier Patient Education ? [place]. ? ?

## 2021-05-21 ENCOUNTER — Encounter: Payer: Self-pay | Admitting: Physician Assistant

## 2021-05-21 ENCOUNTER — Ambulatory Visit (INDEPENDENT_AMBULATORY_CARE_PROVIDER_SITE_OTHER): Payer: Medicare Other | Admitting: Physician Assistant

## 2021-05-21 VITALS — BP 151/84 | HR 82 | Temp 97.7°F | Ht 65.5 in | Wt 121.0 lb

## 2021-05-21 DIAGNOSIS — M810 Age-related osteoporosis without current pathological fracture: Secondary | ICD-10-CM | POA: Insufficient documentation

## 2021-05-21 DIAGNOSIS — M1991 Primary osteoarthritis, unspecified site: Secondary | ICD-10-CM | POA: Insufficient documentation

## 2021-05-21 DIAGNOSIS — Z23 Encounter for immunization: Secondary | ICD-10-CM

## 2021-05-21 DIAGNOSIS — Z1231 Encounter for screening mammogram for malignant neoplasm of breast: Secondary | ICD-10-CM | POA: Diagnosis not present

## 2021-05-21 DIAGNOSIS — Z79899 Other long term (current) drug therapy: Secondary | ICD-10-CM | POA: Insufficient documentation

## 2021-05-21 DIAGNOSIS — M545 Low back pain, unspecified: Secondary | ICD-10-CM | POA: Insufficient documentation

## 2021-05-21 DIAGNOSIS — Z1211 Encounter for screening for malignant neoplasm of colon: Secondary | ICD-10-CM

## 2021-05-21 DIAGNOSIS — Z0000 Encounter for general adult medical examination without abnormal findings: Secondary | ICD-10-CM | POA: Diagnosis not present

## 2021-05-21 DIAGNOSIS — M069 Rheumatoid arthritis, unspecified: Secondary | ICD-10-CM | POA: Insufficient documentation

## 2021-05-21 DIAGNOSIS — R5383 Other fatigue: Secondary | ICD-10-CM | POA: Insufficient documentation

## 2021-05-21 DIAGNOSIS — K760 Fatty (change of) liver, not elsewhere classified: Secondary | ICD-10-CM | POA: Insufficient documentation

## 2021-05-21 DIAGNOSIS — I7300 Raynaud's syndrome without gangrene: Secondary | ICD-10-CM | POA: Insufficient documentation

## 2021-05-21 NOTE — Patient Instructions (Signed)
Managing Anxiety This video describes anxiety and what can be done to manage it. To view the content, go to this web address: [email or web address]  This video will expire on: 04/08/2023. If you need access to this video following this date, please reach out to the healthcare provider who assigned it to you. This information is not intended to replace advice given to you by your health care provider. Make sure you discuss any questions you have with your health care provider. Elsevier Patient Education  2023 Elsevier Inc.  

## 2021-05-21 NOTE — Progress Notes (Signed)
? ?Subjective:  ? Carla Lamb is a 66 y.o. female who presents for Medicare Annual (Subsequent) preventive examination. ? ?  ?  ?  ?I connected with  [name] on 05/21/21 by an audio only telemedicine application and verified that I am speaking with the correct person using two identifiers. ?  ?I discussed the limitations, risks, security and privacy concerns of performing an evaluation and management service by telephone and the availability of in person appointments. I also discussed with the patient that there may be a patient responsible charge related to this service. The patient expressed understanding and verbally consented to this telephonic visit. ? ?Location of Patient: Office  ?Location of Provider: Office  ? ?List any persons and their role that are participating in the visit with the patient.   ? ? ? ?Review of Systems    ?Refer to PCP  ? ?   ?Objective:  ?  ?There were no vitals filed for this visit. ?There is no height or weight on file to calculate BMI. ? ? ?  12/19/2020  ?  2:02 PM 08/25/2011  ?  2:15 PM 08/24/2011  ?  8:31 AM  ?Advanced Directives  ?Does Patient Have a Medical Advance Directive? Yes Patient has advance directive, copy not in chart Patient has advance directive, copy not in chart  ?Type of Paramedic of [place];Living will [place];Living will [place];Living will  ?Copy of [place] in Chart? No - copy requested Copy requested from family Copy requested from family  ?Pre-existing out of facility DNR order (yellow form or pink MOST form)   No  ? ? ?Current Medications (verified) ?Outpatient Encounter Medications as of 05/21/2021  ?Medication Sig  ? acetaminophen (TYLENOL) 500 MG tablet Take 2 tablets (1,000 mg total) by mouth every 6 (six) hours as needed for mild pain or moderate pain.  ? cetirizine (ZYRTEC) 10 MG tablet Take 10 mg by mouth daily.  ? cholecalciferol (VITAMIN D3) 25 MCG (1000  UNIT) tablet Take 2,000 Units by mouth daily.  ? Cyanocobalamin (VITAMIN B-12) 5000 MCG TBDP Take 5,000 mg by mouth daily.  ? folic acid (FOLVITE) 1 MG tablet Take 3 mg by mouth daily.  ? Golimumab ([place] ARIA IV) Inject 1 Dose into the vein every 8 (eight) weeks.  ? hydroxychloroquine (PLAQUENIL) 200 MG tablet Take 400 mg by mouth daily.  ? methotrexate (RHEUMATREX) 2.5 MG tablet Take 15 mg by mouth once a week. Monday.  Caution:Chemotherapy. Protect from light.  ? ?No facility-administered encounter medications on file as of 05/21/2021.  ? ? ?Allergies (verified) ?Patient has no known allergies.  ? ?History: ?Past Medical History:  ?Diagnosis Date  ? Anxiety   ? Bursitis   ? Cancer [facility])   ? Right Mastectomy [place] Disease  ? Depression   ? During treament  ? HSV-1 infection   ? Osteoporosis 01/2017  ? 2017 T score -2.5 2019 T score -2.3.  ? RA (rheumatoid arthritis) ([place])   ? ?Past Surgical History:  ?Procedure Laterality Date  ? ANTERIOR CERVICAL DECOMP/DISCECTOMY FUSION  08/25/2011  ? Procedure: ANTERIOR CERVICAL DECOMPRESSION/DISCECTOMY FUSION 3 LEVELS;  Surgeon: Floyce Stakes, MD;  Location: [facility];  Service: Neurosurgery;  Laterality: N/A;  Anterior Cervical Decompression/Discectomy Fusion Cervical Four-Five Cervical Six Corpectomy, Fusion to cervical Seven.  ? bunions    ? bilat feet  ? CERVICAL FUSION  08/25/2011  ? C 4  5 & 6  ?  MASTECTOMY  2006  ? Right  ? TOTAL HIP ARTHROPLASTY Left 12/31/2020  ? Procedure: TOTAL HIP ARTHROPLASTY ANTERIOR APPROACH;  Surgeon: Renette Butters, MD;  Location: [facility];  Service: Orthopedics;  Laterality: Left;  ? Vaginal Cyst Removed  2010  ? ?Family History  ?Problem Relation Age of Onset  ? Cancer Father   ?     LUNG- SMOKER  ? Cancer Brother 76  ?     RENAL  ? Breast cancer Mother 44  ? Hypertension Mother   ? Cancer Maternal Grandfather   ?     Colon  ? Diverticulitis Sister   ? ?Social History  ? ?Socioeconomic History  ? Marital status: Married  ?  Spouse  name: Not on file  ? Number of children: Not on file  ? Years of education: Not on file  ? Highest education level: Not on file  ?Occupational History  ? Not on file  ?Tobacco Use  ? Smoking status: Every Day  ?  Packs/day: 0.25  ?  Years: 0.00  ?  Pack years: 0.00  ?  Types: Cigarettes  ?  Last attempt to quit: 01/19/1978  ?  Years since quitting: 43.3  ? Smokeless tobacco: Never  ?Vaping Use  ? Vaping Use: Never used  ?Substance and Sexual Activity  ? Alcohol use: Yes  ?  Alcohol/week: 8.0 standard drinks  ?  Types: 8 Cans of beer per week  ? Drug use: No  ? Sexual activity: Not Currently  ?  Partners: Male  ?  Birth control/protection: Post-menopausal  ?  Comment: 1st intercourse 66 yo-Fewer than 5 partners  ?Other Topics Concern  ? Not on file  ?Social History Narrative  ? Not on file  ? ?Social Determinants of Health  ? ?Financial Resource Strain: Not on file  ?Food Insecurity: Not on file  ?Transportation Needs: Not on file  ?Physical Activity: Not on file  ?Stress: Not on file  ?Social Connections: Not on file  ? ? ?Tobacco Counseling ?Ready to quit: Not Answered ?Counseling given: Not Answered ? ? ?Clinical Intake: ? ?  ? ?  ? ?  ? ?  ? ?  ? ?Diabetic?No ? ?  ? ?  ? ? ?Activities of Daily Living ? ?  12/19/2020  ?  2:04 PM 12/19/2020  ?  1:59 PM  ?In your present state of health, do you have any difficulty performing the following activities:  ?Hearing?  0  ?Vision?  0  ?Difficulty concentrating or making decisions?  0  ?Walking or climbing stairs?  0  ?Dressing or bathing?  0  ?Doing errands, shopping? 0   ? ? ?Patient Care Team: ?Ellouise Newer as PCP - General (Physician Assistant) ? ?Indicate any recent Medical Services you may have received from other than [facility] providers in the past year (date may be approximate). ? ?   ?Assessment:  ? This is a routine wellness examination for Carla Lamb. ? ?Hearing/Vision screen ?No results found. ? ?Dietary issues and exercise activities discussed: ?  ? ? Goals  Addressed   ?None ?  ?Depression Screen ? ?  11/21/2020  ?  9:54 AM 11/18/2020  ? 11:36 AM  ?PHQ 2/9 Scores  ?PHQ - 2 Score 2 2  ?PHQ- 9 Score 7 5  ?  ?Fall Risk ? ?  11/21/2020  ?  9:55 AM 11/18/2020  ? 11:35 AM  ?Fall Risk   ?Falls in the past year? 0 0  ?Number falls  in past yr: 0 0  ?Injury with Fall? 0 0  ?Risk for fall due to :  No Fall Risks  ?Follow up Falls evaluation completed Falls evaluation completed  ? ? ?FALL RISK PREVENTION PERTAINING TO THE HOME: ? ?Any stairs in or around the home? Yes  ?If so, are there any without handrails? Yes  ?Home free of loose throw rugs in walkways, pet beds, electrical cords, etc? Yes  ?Adequate lighting in your home to reduce risk of falls? Yes  ? ?ASSISTIVE DEVICES UTILIZED TO PREVENT FALLS: ? ?Life alert? No  ?Use of a cane, walker or w/c? No  ?Grab bars in the bathroom? No  ?Shower chair or bench in shower? No  ?Elevated toilet seat or a handicapped toilet? Yes ? ?TIMED UP AND GO: ? ?Was the test performed? Yes .  ?Length of time to ambulate 10 feet:  10 sec.  ? ?Gait slow and steady without use of assistive device ? ?Cognitive Function: ?  ?  ?  ? ?Immunizations ?Immunization History  ?Administered Date(s) Administered  ? Tdap 03/18/2011  ? ? ?TDAP status: Due, Education has been provided regarding the importance of this vaccine. Advised may receive this vaccine at local pharmacy or Health Dept. Aware to provide a copy of the vaccination record if obtained from local pharmacy or Health Dept. Verbalized acceptance and understanding. ? ?Flu Vaccine status: Up to date ? ?Pneumococcal vaccine status: Due, Education has been provided regarding the importance of this vaccine. Advised may receive this vaccine at local pharmacy or Health Dept. Aware to provide a copy of the vaccination record if obtained from local pharmacy or Health Dept. Verbalized acceptance and understanding. ? ?Covid-19 vaccine status: Completed vaccines ? ?Qualifies for Shingles Vaccine? Yes   ?Zostavax  completed No   ?Shingrix Completed?: No.    Education has been provided regarding the importance of this vaccine. Patient has been advised to call insurance company to determine out of pocket expense if they have not

## 2021-05-22 ENCOUNTER — Encounter: Payer: Self-pay | Admitting: Physician Assistant

## 2021-05-22 ENCOUNTER — Telehealth (INDEPENDENT_AMBULATORY_CARE_PROVIDER_SITE_OTHER): Payer: Medicare Other | Admitting: Physician Assistant

## 2021-05-22 VITALS — Ht 65.5 in | Wt 121.0 lb

## 2021-05-22 DIAGNOSIS — F419 Anxiety disorder, unspecified: Secondary | ICD-10-CM

## 2021-05-22 DIAGNOSIS — F439 Reaction to severe stress, unspecified: Secondary | ICD-10-CM

## 2021-05-22 DIAGNOSIS — F32A Depression, unspecified: Secondary | ICD-10-CM | POA: Diagnosis not present

## 2021-05-22 MED ORDER — DULOXETINE HCL 20 MG PO CPEP: 20.0000 mg | ORAL_CAPSULE | Freq: Every day | ORAL | 2 refills | Status: DC

## 2021-05-22 NOTE — Progress Notes (Signed)
? ?  ? ? ?Telehealth office visit note for Carla Reid, PA-C- at Primary Care at [facility] ?  ?I connected with current patient today by telephone and verified that I am speaking with the correct person   ? Location of the patient: Home ? Location of the provider: Office ?- This visit type was conducted due to national recommendations for restrictions regarding the COVID-19 Pandemic (e.g. social distancing) in an effort to limit this patient's exposure and mitigate transmission in our community.    ?- No physical exam could be performed with this format, beyond that communicated to [place] by the patient/ family members as noted.   ?- Additionally my office staff/ schedulers were to discuss with the patient that there may be a monetary charge related to this service, depending on their medical insurance.  My understanding is that patient understood and consented to proceed.   ?  ?_________________________________________________________________________________ ? ? ?History of Present Illness: ?Patient calls in to discuss anxiety. Reports her husband has been pointing out her anxiety. States recently had surgery and has a stressful job where she is trying to catch up on work missed. Is having trouble sleeping at night. States in the past has tried Zoloft when she was going through chemo for breast cancer and did not like how the medication made her feel. Also reports tried others but unable to recall which ones. ? ? ? ? ? ?  05/21/2021  ?  9:34 AM 11/21/2020  ?  9:55 AM 11/18/2020  ? 11:36 AM  ?GAD 7 : Generalized Anxiety Score  ?Nervous, Anxious, on Edge '3 1 1  '$ ?Control/stop worrying 2 0 1  ?Worry too much - different things '1 1 1  '$ ?Trouble relaxing '2 1 1  '$ ?Restless 0 0 1  ?Easily annoyed or irritable '1 1 1  '$ ?Afraid - awful might happen 0 0 0  ?Total GAD 7 Score '9 4 6  '$ ?Anxiety Difficulty Very difficult  Somewhat difficult  ? ? ? ?  05/21/2021  ?  9:34 AM 11/21/2020  ?  9:54 AM 11/18/2020  ? 11:36 AM  ?Depression screen  PHQ 2/9  ?Decreased Interest '1 1 1  '$ ?Down, Depressed, Hopeless '1 1 1  '$ ?PHQ - 2 Score '2 2 2  '$ ?Altered sleeping 1 1 0  ?Tired, decreased energy '1 1 1  '$ ?Change in appetite 0 0 0  ?Feeling bad or failure about yourself  0 0 0  ?Trouble concentrating '1 1 1  '$ ?Moving slowly or fidgety/restless '1 1 1  '$ ?Suicidal thoughts 0 1 0  ?PHQ-9 Score '6 7 5  '$ ?Difficult doing work/chores Very difficult  Somewhat difficult  ? ? ? ? ?Impression and Recommendations:   ? ? ?1. Anxiety   ?2. Mild episode of depression   ?3. Stress   ?  ?GAD-7 score of 9, higher than baseline. PHQ-9 score of 6. No SI/HI. Discussed with patient medication therapy options and wants to trial SNRI. Will start duloxetine 20 mg. Advised to let me know if unable to tolerate medication. Recommend stress reduction techniques. Will reassess mood and medication therapy in 6 weeks. ? ? ? ?- As part of my medical decision making, I reviewed the following data within the [place] History obtained from pt /family, CMA notes reviewed and incorporated if applicable, Labs reviewed, Radiograph/ tests reviewed if applicable and OV notes from prior OV's with me, as well as any other specialists she/he has seen since seeing me last, were all reviewed  and used in my medical decision making process today.   ? ?- Additionally, when appropriate, discussion had with patient regarding our treatment plan, and their biases/concerns about that plan were used in my medical decision making today.   ? ?- The patient agreed with the plan and demonstrated an understanding of the instructions.   No barriers to understanding were identified.  ?   ?- The patient was advised to call back or seek an in-person evaluation if the symptoms worsen or if the condition fails to improve as anticipated. ? ? ?Return in about 6 weeks (around 07/03/2021) for Mood- started med.  ? ? ?No orders of the defined types were placed in this encounter. ? ? ?Meds ordered this encounter  ?Medications  ?  DULoxetine (CYMBALTA) 20 MG capsule  ?  Sig: Take 1 capsule (20 mg total) by mouth daily.  ?  Dispense:  30 capsule  ?  Refill:  2  ?  Order Specific Question:   Supervising Provider  ?  Answer:   Beatrice Lecher D [[id number]]  ? ? ?There are no discontinued medications.  ? ? ? ?Time spent on telephone encounter was 8 minutes. ? ? ? ? ? ?The [place] was signed into law in 2016 which includes the topic of electronic health records.  This provides immediate access to information in MyChart.  This includes consultation notes, operative notes, office notes, lab results and pathology reports.  If you have any questions about what you read please let us know at your next visit or call us at the office.  We are right here with you. ? ? ?__________________________________________________________________________________ ? ? ? ? ?Patient Care Team  ?  Relationship Specialty Notifications Start End  ?Carla Reid, PA-C PCP - General Physician Assistant  11/18/20   ? ? ? ?-Vitals obtained; medications/ allergies reconciled;  personal medical, social, Sx etc.histories were updated by CMA, reviewed by me and are reflected in chart ? ? ?Patient Active Problem List  ? Diagnosis Date Noted  ? Fatigue 05/21/2021  ? Fatty liver 05/21/2021  ? Low back pain at multiple sites 05/21/2021  ? Osteoporosis 05/21/2021  ? Other long term (current) drug therapy 05/21/2021  ? Primary osteoarthritis 05/21/2021  ? Raynaud's disease 05/21/2021  ? Rheumatoid arthritis ([place]) 05/21/2021  ? Status post right mastectomy 09/11/2017  ? Malignant neoplasm of right breast ([place]) 07/25/2013  ? ? ? ?Current Meds  ?Medication Sig  ? acetaminophen (TYLENOL) 500 MG tablet Take 2 tablets (1,000 mg total) by mouth every 6 (six) hours as needed for mild pain or moderate pain.  ? cetirizine (ZYRTEC) 10 MG tablet Take 10 mg by mouth daily.  ? cholecalciferol (VITAMIN D3) 25 MCG (1000 UNIT) tablet Take 2,000 Units by mouth daily.  ? DULoxetine  (CYMBALTA) 20 MG capsule Take 1 capsule (20 mg total) by mouth daily.  ? folic acid (FOLVITE) 1 MG tablet Take 3 mg by mouth daily.  ? Golimumab ([place] ARIA IV) Inject 1 Dose into the vein every 8 (eight) weeks.  ? hydroxychloroquine (PLAQUENIL) 200 MG tablet Take 400 mg by mouth daily.  ? methotrexate (RHEUMATREX) 2.5 MG tablet Take 15 mg by mouth once a week. Monday.  Caution:Chemotherapy. Protect from light.  ? ? ? ?Allergies:  ?No Known Allergies ? ? ?ROS:  ?See above HPI for pertinent positives and negatives ? ? ?Objective:   ?Height 5' 5.5" (1.664 m), weight 121 lb (54.9 kg).  ?(if some vitals are omitted, this  means that patient was UNABLE to obtain them.) ?General: A & O * 3; sounds in no acute distress ?Respiratory: speaking in full sentences, no conversational dyspnea; patient confirms no use of accessory muscles ?Psych: insight appears good, mood- appears full ? ?  ? ?

## 2021-05-26 ENCOUNTER — Telehealth: Payer: Self-pay | Admitting: Physician Assistant

## 2021-05-26 NOTE — Telephone Encounter (Signed)
Patient wanted to call and let you know she has not started the Cymbalta due to side effects and she has contacted her rheumatologist to see if it will have any interactions with other medications/liver. Also she is concerned with the price of the medication. Chesley Noon will you please give her a call back?  ?

## 2021-05-27 NOTE — Telephone Encounter (Signed)
Patient states the cost of medication is too high. Advised to contact insurance company to see which medication are covered by insurance plan. Patient states she will reach back out to [place]. AS,CMA ?

## 2021-06-13 ENCOUNTER — Other Ambulatory Visit: Payer: Self-pay | Admitting: Physician Assistant

## 2021-06-13 DIAGNOSIS — F419 Anxiety disorder, unspecified: Secondary | ICD-10-CM

## 2021-07-15 LAB — COLOGUARD: COLOGUARD: NEGATIVE

## 2021-08-19 ENCOUNTER — Telehealth: Payer: Self-pay | Admitting: *Deleted

## 2021-08-19 NOTE — Telephone Encounter (Signed)
Prolia insurance verification has been sent awaiting Summary of benefits  

## 2021-09-04 ENCOUNTER — Other Ambulatory Visit: Payer: Self-pay | Admitting: Orthopedic Surgery

## 2021-09-04 DIAGNOSIS — S42402A Unspecified fracture of lower end of left humerus, initial encounter for closed fracture: Secondary | ICD-10-CM

## 2021-09-05 ENCOUNTER — Ambulatory Visit
Admission: RE | Admit: 2021-09-05 | Discharge: 2021-09-05 | Disposition: A | Discharge status: Home / Self Care | Payer: Medicare Other | Source: Ambulatory Visit | Attending: Orthopedic Surgery | Admitting: Orthopedic Surgery

## 2021-09-05 DIAGNOSIS — S42402A Unspecified fracture of lower end of left humerus, initial encounter for closed fracture: Secondary | ICD-10-CM

## 2021-09-08 HISTORY — PX: OTHER SURGICAL HISTORY: SHX169

## 2021-09-26 NOTE — Telephone Encounter (Addendum)
  Dual coverage   Deductible [id number] met)   Annual exam 03/05/2021  Calcium 9.6          Date 11/18/2020  Upcoming dental procedures NO  Prior Authorization needed NO  Pt estimated Cost $0   Appt 10/28/2021    Coverage Details:0% one dose, 0% admin fee

## 2021-09-26 NOTE — Telephone Encounter (Addendum)
Patient is due to have her Prolia injection  Patient states she recently had elbow surgery 09/08/2021. I would like recommendations on when to proceed with Prolia due to surgery. Routing to provider for recommendations.  Per pt clearance approved to have Prolia

## 2021-10-13 NOTE — Progress Notes (Signed)
Established patient visit   Patient: Carla Lamb   DOB: Jun 08, 1955   66 y.o. Female  MRN: [id number] Visit Date: 10/14/2021   Chief Complaint  Patient presents with   Otalgia   Subjective    HPI  Follow up  -ears hurting.  -left ear feels full. Always feels like it's feel like there is water in it.  -hearing difficulty from left ear.  -had been havng dizziness for some time.  -dizziness so severe it caused her to fall and break her arm.  Dizziness is most severe when she stands up quickly from seated position especially, if she is bent over.  -did start taking duloxetine earlier this year. Has reduced that to every other day as this has listed side effect of dizziness.  -reports less dizziness.     Medications: Outpatient Medications Prior to Visit  Medication Sig   acetaminophen (TYLENOL) 500 MG tablet Take 2 tablets (1,000 mg total) by mouth every 6 (six) hours as needed for mild pain or moderate pain.   cetirizine (ZYRTEC) 10 MG tablet Take 10 mg by mouth daily.   cholecalciferol (VITAMIN D3) 25 MCG (1000 UNIT) tablet Take 2,000 Units by mouth daily.   DULoxetine (CYMBALTA) 20 MG capsule TAKE 1 CAPSULE BY MOUTH EVERY DAY   folic acid (FOLVITE) 1 MG tablet Take 3 mg by mouth daily.   Golimumab (SIMPONI ARIA IV) Inject 1 Dose into the vein every 8 (eight) weeks.   hydroxychloroquine (PLAQUENIL) 200 MG tablet Take 400 mg by mouth daily.   methotrexate (RHEUMATREX) 2.5 MG tablet Take 15 mg by mouth once a week. Monday.  Caution:Chemotherapy. Protect from light.   No facility-administered medications prior to visit.    Review of Systems  Constitutional:  Positive for fatigue. Negative for activity change, appetite change, chills and fever.  HENT:  Positive for ear pain and hearing loss. Negative for congestion, postnasal drip, rhinorrhea, sinus pressure, sinus pain, sneezing and sore throat.   Eyes: Negative.   Respiratory:  Negative for cough, chest tightness, shortness of  breath and wheezing.   Cardiovascular:  Negative for chest pain and palpitations.  Gastrointestinal:  Negative for abdominal pain, constipation, diarrhea, nausea and vomiting.  Endocrine: Negative for cold intolerance, heat intolerance, polydipsia and polyuria.  Genitourinary:  Negative for dyspareunia, dysuria, flank pain, frequency and urgency.  Musculoskeletal:  Negative for arthralgias, back pain and myalgias.  Skin:  Negative for rash.  Allergic/Immunologic: Positive for environmental allergies.  Neurological:  Positive for dizziness. Negative for weakness and headaches.  Hematological:  Negative for adenopathy.  Psychiatric/Behavioral:  The patient is not nervous/anxious.       Objective     Today's Vitals   10/14/21 1134  BP: (Abnormal) 148/82  Pulse: 75  Temp: 97.6 F (36.4 C)  TempSrc: Oral  SpO2: 100%  Weight: 123 lb 8 oz (56 kg)  Height: 5' 5.5" (1.664 m)   Body mass index is 20.24 kg/m.   BP Readings from Last 3 Encounters:  10/14/21 (Abnormal) 148/82  05/21/21 (Abnormal) 151/84  03/05/21 124/78    Wt Readings from Last 3 Encounters:  10/14/21 123 lb 8 oz (56 kg)  05/22/21 121 lb (54.9 kg)  05/21/21 121 lb (54.9 kg)    Physical Exam Vitals and nursing note reviewed.  Constitutional:      Appearance: Normal appearance. She is well-developed.  HENT:     Head: Normocephalic and atraumatic.     Right Ear: Tenderness present. Tympanic membrane is erythematous and  bulging.     Left Ear: Tenderness present. Tympanic membrane is erythematous and bulging.     Nose: Congestion present.     Right Sinus: Maxillary sinus tenderness and frontal sinus tenderness present.     Left Sinus: Maxillary sinus tenderness and frontal sinus tenderness present.  Eyes:     Pupils: Pupils are equal, round, and reactive to light.  Cardiovascular:     Rate and Rhythm: Normal rate and regular rhythm.     Pulses: Normal pulses.     Heart sounds: Normal heart sounds.  Pulmonary:      Effort: Pulmonary effort is normal.     Breath sounds: Normal breath sounds.  Abdominal:     Palpations: Abdomen is soft.  Musculoskeletal:        General: Normal range of motion.     Cervical back: Normal range of motion and neck supple.  Lymphadenopathy:     Cervical: Cervical adenopathy present.  Skin:    General: Skin is warm and dry.     Capillary Refill: Capillary refill takes less than 2 seconds.  Neurological:     General: No focal deficit present.     Mental Status: She is alert and oriented to person, place, and time.  Psychiatric:        Mood and Affect: Mood normal.        Behavior: Behavior normal.        Thought Content: Thought content normal.        Judgment: Judgment normal.       Assessment & Plan    1. Acute otitis media, unspecified otitis media type Likely cause of decreased hearing and vertigo.  Treat with amoxicillin 875 twice daily for 7 days.  And Floxin eardrops.  Use 5 drops in both ears daily for next 7 days. Rest and increase fluids. Continue using OTC medication to control symptoms.  Encouraged her to contact office if symptoms worsen or do not improve over the next 5 to 7 days. - amoxicillin (AMOXIL) 875 MG tablet; Take 1 tablet (875 mg total) by mouth 2 (two) times daily.  Dispense: 14 tablet; Refill: 0 - ofloxacin (FLOXIN OTIC) 0.3 % OTIC solution; Place 5 drops into both ears daily.  Dispense: 5 mL; Refill: 0  2. Dizziness Likely due to to acute otitis media.  Reassess for worsening and persistent symptoms.   Problem List Items Addressed This Visit       Nervous and Auditory   Acute otitis media - Primary   Relevant Medications   amoxicillin (AMOXIL) 875 MG tablet   ofloxacin (FLOXIN OTIC) 0.3 % OTIC solution     Other   Dizziness     Return for anxiety, dizziness .         Ronnell Freshwater, NP  [facility] at [facility] [phone] (phone) [phone] (fax)  [place]

## 2021-10-14 ENCOUNTER — Ambulatory Visit (INDEPENDENT_AMBULATORY_CARE_PROVIDER_SITE_OTHER): Payer: Medicare Other | Admitting: Nurse Practitioner

## 2021-10-14 ENCOUNTER — Encounter: Payer: Self-pay | Admitting: Nurse Practitioner

## 2021-10-14 VITALS — BP 148/82 | HR 75 | Temp 97.6°F | Ht 65.5 in | Wt 123.5 lb

## 2021-10-14 DIAGNOSIS — R42 Dizziness and giddiness: Secondary | ICD-10-CM

## 2021-10-14 DIAGNOSIS — H6690 Otitis media, unspecified, unspecified ear: Secondary | ICD-10-CM

## 2021-10-14 MED ORDER — AMOXICILLIN 875 MG PO TABS: 875.0000 mg | ORAL_TABLET | Freq: Two times a day (BID) | ORAL | 0 refills | Status: DC

## 2021-10-14 MED ORDER — OFLOXACIN 0.3 % OT SOLN: 5.0000 [drp] | Freq: Every day | OTIC | 0 refills | Status: DC

## 2021-10-16 LAB — HM MAMMOGRAPHY

## 2021-10-26 DIAGNOSIS — H6690 Otitis media, unspecified, unspecified ear: Secondary | ICD-10-CM | POA: Insufficient documentation

## 2021-10-26 DIAGNOSIS — R42 Dizziness and giddiness: Secondary | ICD-10-CM | POA: Insufficient documentation

## 2021-10-28 ENCOUNTER — Ambulatory Visit (INDEPENDENT_AMBULATORY_CARE_PROVIDER_SITE_OTHER): Payer: Medicare Other

## 2021-10-28 DIAGNOSIS — M810 Age-related osteoporosis without current pathological fracture: Secondary | ICD-10-CM | POA: Diagnosis not present

## 2021-10-28 MED ORDER — DENOSUMAB 60 MG/ML ~~LOC~~ SOSY
60.0000 mg | PREFILLED_SYRINGE | Freq: Once | SUBCUTANEOUS | Status: AC
  Administered 2021-10-28: 60 mg via SUBCUTANEOUS

## 2021-11-11 ENCOUNTER — Ambulatory Visit (INDEPENDENT_AMBULATORY_CARE_PROVIDER_SITE_OTHER): Payer: Medicare Other | Admitting: Nurse Practitioner

## 2021-11-11 ENCOUNTER — Encounter: Payer: Self-pay | Admitting: Nurse Practitioner

## 2021-11-11 VITALS — BP 121/76 | HR 90 | Ht 65.5 in | Wt 122.1 lb

## 2021-11-11 DIAGNOSIS — J309 Allergic rhinitis, unspecified: Secondary | ICD-10-CM | POA: Diagnosis not present

## 2021-11-11 MED ORDER — FEXOFENADINE HCL 180 MG PO TABS: 180.0000 mg | ORAL_TABLET | Freq: Every day | ORAL | 1 refills | Status: DC

## 2021-11-11 MED ORDER — FLUTICASONE PROPIONATE 50 MCG/ACT NA SUSP: 2.0000 | Freq: Every day | NASAL | 6 refills | Status: DC

## 2021-11-11 NOTE — Progress Notes (Signed)
Established patient visit   Patient: Carla Lamb   DOB: 08-30-1955   66 y.o. Female  MRN: [id number] Visit Date: 11/11/2021   Chief Complaint  Patient presents with   Follow-up   Subjective    HPI  Follow up -ears feeling full of water, left worse than right  -seen for this at most recent visit.  -treated with amoxicillin and floxin ear drops.  --initially got better, but symptoms now coming and going.  --uses flonase nasal spray  --takes daily cetirizine  Medications: Outpatient Medications Prior to Visit  Medication Sig   acetaminophen (TYLENOL) 500 MG tablet Take 2 tablets (1,000 mg total) by mouth every 6 (six) hours as needed for mild pain or moderate pain.   cholecalciferol (VITAMIN D3) 25 MCG (1000 UNIT) tablet Take 2,000 Units by mouth daily.   DULoxetine (CYMBALTA) 20 MG capsule TAKE 1 CAPSULE BY MOUTH EVERY DAY   folic acid (FOLVITE) 1 MG tablet Take 3 mg by mouth daily.   Golimumab (SIMPONI ARIA IV) Inject 1 Dose into the vein every 8 (eight) weeks.   hydroxychloroquine (PLAQUENIL) 200 MG tablet Take 400 mg by mouth daily.   methotrexate (RHEUMATREX) 2.5 MG tablet Take 15 mg by mouth once a week. Monday.  Caution:Chemotherapy. Protect from light.   [DISCONTINUED] amoxicillin (AMOXIL) 875 MG tablet Take 1 tablet (875 mg total) by mouth 2 (two) times daily.   [DISCONTINUED] cetirizine (ZYRTEC) 10 MG tablet Take 10 mg by mouth daily.   [DISCONTINUED] ofloxacin (FLOXIN OTIC) 0.3 % OTIC solution Place 5 drops into both ears daily.   No facility-administered medications prior to visit.    Review of Systems  Constitutional:  Negative for activity change, appetite change, chills, fatigue and fever.  HENT:  Positive for congestion, ear pain, postnasal drip, rhinorrhea, sinus pressure and sneezing. Negative for sinus pain and sore throat.   Eyes: Negative.   Respiratory:  Positive for cough. Negative for chest tightness, shortness of breath and wheezing.   Cardiovascular:   Negative for chest pain and palpitations.  Gastrointestinal:  Negative for abdominal pain, constipation, diarrhea, nausea and vomiting.  Endocrine: Negative for cold intolerance, heat intolerance, polydipsia and polyuria.  Genitourinary:  Negative for dyspareunia, dysuria, flank pain, frequency and urgency.  Musculoskeletal:  Negative for arthralgias, back pain and myalgias.  Skin:  Negative for rash.  Allergic/Immunologic: Positive for environmental allergies.  Neurological:  Negative for dizziness, weakness and headaches.  Hematological:  Negative for adenopathy.  Psychiatric/Behavioral:  The patient is not nervous/anxious.        Objective     Today's Vitals   11/11/21 1059 11/11/21 1119  BP: (Abnormal) 152/92 121/76  Pulse: (Abnormal) 110 90  SpO2: 100%   Weight: 122 lb 1.9 oz (55.4 kg)   Height: 5' 5.5" (1.664 m)    Body mass index is 20.01 kg/m.  BP Readings from Last 3 Encounters:  11/11/21 121/76  10/14/21 (Abnormal) 148/82  05/21/21 (Abnormal) 151/84    Wt Readings from Last 3 Encounters:  11/11/21 122 lb 1.9 oz (55.4 kg)  10/14/21 123 lb 8 oz (56 kg)  05/22/21 121 lb (54.9 kg)    Physical Exam Vitals and nursing note reviewed.  Constitutional:      Appearance: Normal appearance. She is well-developed.  HENT:     Head: Normocephalic and atraumatic.     Right Ear: Tympanic membrane is bulging.     Left Ear: Tympanic membrane is bulging.     Nose: Congestion present.  Eyes:  Pupils: Pupils are equal, round, and reactive to light.  Cardiovascular:     Rate and Rhythm: Normal rate and regular rhythm.     Pulses: Normal pulses.     Heart sounds: Normal heart sounds.  Pulmonary:     Effort: Pulmonary effort is normal.     Breath sounds: Normal breath sounds.  Abdominal:     Palpations: Abdomen is soft.  Musculoskeletal:        General: Normal range of motion.     Cervical back: Normal range of motion and neck supple.  Lymphadenopathy:     Cervical:  No cervical adenopathy.  Skin:    General: Skin is warm and dry.     Capillary Refill: Capillary refill takes less than 2 seconds.  Neurological:     General: No focal deficit present.     Mental Status: She is alert and oriented to person, place, and time.  Psychiatric:        Mood and Affect: Mood normal.        Behavior: Behavior normal.        Thought Content: Thought content normal.        Judgment: Judgment normal.       Assessment & Plan    1. Chronic allergic rhinitis Symptoms likely due to chronic allergic rhinitis.  Change cetirizine to Allegra 180 mg daily.  Use Flonase.  Use 2 sprays in both nostrils daily. Reasees in 3 months and sooner if needed  - fluticasone (FLONASE) 50 MCG/ACT nasal spray; Place 2 sprays into both nostrils daily.  Dispense: 16 g; Refill: 6 - fexofenadine (ALLEGRA) 180 MG tablet; Take 1 tablet (180 mg total) by mouth daily.  Dispense: 90 tablet; Refill: 1  Problem List Items Addressed This Visit       Respiratory   Chronic allergic rhinitis - Primary   Relevant Medications   fluticasone (FLONASE) 50 MCG/ACT nasal spray   fexofenadine (ALLEGRA) 180 MG tablet     Return in about 3 months (around 02/11/2022) for ears, blood pressure.         Ronnell Freshwater, NP  [facility] at [facility] [phone] (phone) [phone] (fax)  [place]

## 2021-11-30 DIAGNOSIS — J309 Allergic rhinitis, unspecified: Secondary | ICD-10-CM | POA: Insufficient documentation

## 2022-02-11 ENCOUNTER — Encounter: Payer: Self-pay | Admitting: Nurse Practitioner

## 2022-02-11 ENCOUNTER — Ambulatory Visit (INDEPENDENT_AMBULATORY_CARE_PROVIDER_SITE_OTHER): Payer: Medicare Other | Admitting: Nurse Practitioner

## 2022-02-11 VITALS — BP 120/82 | HR 108 | Resp 18 | Ht 65.5 in | Wt 123.0 lb

## 2022-02-11 DIAGNOSIS — M059 Rheumatoid arthritis with rheumatoid factor, unspecified: Secondary | ICD-10-CM | POA: Diagnosis not present

## 2022-02-11 DIAGNOSIS — J309 Allergic rhinitis, unspecified: Secondary | ICD-10-CM

## 2022-02-11 DIAGNOSIS — F32A Depression, unspecified: Secondary | ICD-10-CM

## 2022-02-11 NOTE — Progress Notes (Signed)
Established patient visit   Patient: Carla Lamb   DOB: [birth year]   67 y.o. Female  MRN: [id number] Visit Date: 02/11/2022   Chief Complaint  Patient presents with   Follow-up   Hypertension   Otitis Media   Subjective    HPI  Follow up  -chronic allergic rhinitis  --started allegra daily  --flonase nasal spray, -elevated blood pressure  -symptoms improved since last visit    Medications: Outpatient Medications Prior to Visit  Medication Sig   acetaminophen (TYLENOL) 500 MG tablet Take 2 tablets (1,000 mg total) by mouth every 6 (six) hours as needed for mild pain or moderate pain.   DULoxetine (CYMBALTA) 20 MG capsule TAKE 1 CAPSULE BY MOUTH EVERY DAY   fexofenadine (ALLEGRA) 180 MG tablet Take 1 tablet (180 mg total) by mouth daily.   fluticasone (FLONASE) 50 MCG/ACT nasal spray Place 2 sprays into both nostrils daily.   folic acid (FOLVITE) 1 MG tablet Take 3 mg by mouth daily.   Golimumab (SIMPONI ARIA IV) Inject 1 Dose into the vein every 8 (eight) weeks.   hydroxychloroquine (PLAQUENIL) 200 MG tablet Take 400 mg by mouth daily.   methotrexate (RHEUMATREX) 2.5 MG tablet Take 15 mg by mouth once a week. Monday.  Caution:Chemotherapy. Protect from light.   [DISCONTINUED] cholecalciferol (VITAMIN D3) 25 MCG (1000 UNIT) tablet Take 2,000 Units by mouth daily. (Patient not taking: Reported on 03/06/2022)   No facility-administered medications prior to visit.    Review of Systems  Constitutional:  Negative for activity change, appetite change, chills, fatigue and fever.  HENT:  Positive for postnasal drip and rhinorrhea. Negative for congestion, sinus pressure, sinus pain, sneezing and sore throat.   Eyes: Negative.   Respiratory:  Negative for cough, chest tightness, shortness of breath and wheezing.   Cardiovascular:  Negative for chest pain and palpitations.  Gastrointestinal:  Negative for abdominal pain, constipation, diarrhea, nausea and vomiting.  Endocrine:  Negative for cold intolerance, heat intolerance, polydipsia and polyuria.  Genitourinary:  Negative for dyspareunia, dysuria, flank pain, frequency and urgency.  Musculoskeletal:  Negative for arthralgias, back pain and myalgias.  Skin:  Negative for rash.  Allergic/Immunologic: Positive for environmental allergies.  Neurological:  Positive for headaches. Negative for dizziness and weakness.  Hematological:  Negative for adenopathy.  Psychiatric/Behavioral:  The patient is not nervous/anxious.     Last CBC Lab Results  Component Value Date   WBC 7.8 12/19/2020   HGB 14.8 12/19/2020   HCT 45.0 12/19/2020   MCV 102.7 (H) 12/19/2020   MCH 33.8 12/19/2020   RDW 13.4 12/19/2020   PLT 307 [id number]   Last metabolic panel Lab Results  Component Value Date   GLUCOSE 88 11/18/2020   NA 140 11/18/2020   K 4.4 11/18/2020   CL 103 11/18/2020   CO2 25 11/18/2020   BUN 10 11/18/2020   CREATININE 0.78 11/18/2020   EGFR 83 11/18/2020   CALCIUM 9.6 11/18/2020   PROT 7.1 11/18/2020   ALBUMIN 4.7 11/18/2020   LABGLOB 2.4 11/18/2020   AGRATIO 2.0 11/18/2020   BILITOT 0.3 11/18/2020   ALKPHOS 97 11/18/2020   AST 24 11/18/2020   ALT 28 11/18/2020   Last lipids Lab Results  Component Value Date   CHOL 264 (H) 11/18/2020   HDL 121 11/18/2020   LDLCALC 128 (H) 11/18/2020   TRIG 92 11/18/2020   CHOLHDL 2.2 11/18/2020    Last thyroid functions Lab Results  Component Value Date   TSH 1.08 01/27/2017  Last vitamin D Lab Results  Component Value Date   VD25OH 27.3 (L) 11/18/2020       Objective     Today's Vitals   02/11/22 0831  BP: 120/82  Pulse: (Abnormal) 108  Resp: 18  SpO2: 100%  Weight: 123 lb (55.8 kg)  Height: 5' 5.5" (1.664 m)   Body mass index is 20.16 kg/m.  BP Readings from Last 3 Encounters:  03/06/22 122/80  02/11/22 120/82  11/11/21 121/76    Wt Readings from Last 3 Encounters:  03/06/22 120 lb (54.4 kg)  02/11/22 123 lb (55.8 kg)  11/11/21  122 lb 1.9 oz (55.4 kg)    Physical Exam Vitals and nursing note reviewed.  Constitutional:      Appearance: Normal appearance. She is well-developed.  HENT:     Head: Normocephalic and atraumatic.     Nose: Nose normal.     Mouth/Throat:     Mouth: Mucous membranes are moist.     Pharynx: Oropharynx is clear.  Eyes:     Extraocular Movements: Extraocular movements intact.     Conjunctiva/sclera: Conjunctivae normal.     Pupils: Pupils are equal, round, and reactive to light.  Cardiovascular:     Rate and Rhythm: Normal rate and regular rhythm.     Pulses: Normal pulses.     Heart sounds: Normal heart sounds.     Comments: Mildly elevated heart rate  Pulmonary:     Effort: Pulmonary effort is normal.     Breath sounds: Normal breath sounds.  Abdominal:     Palpations: Abdomen is soft.  Musculoskeletal:        General: Normal range of motion.     Cervical back: Normal range of motion and neck supple.  Lymphadenopathy:     Cervical: No cervical adenopathy.  Skin:    General: Skin is warm and dry.     Capillary Refill: Capillary refill takes less than 2 seconds.  Neurological:     General: No focal deficit present.     Mental Status: She is alert and oriented to person, place, and time.  Psychiatric:        Mood and Affect: Mood normal.        Behavior: Behavior normal.        Thought Content: Thought content normal.        Judgment: Judgment normal.     Results for orders placed or performed in visit on 02/11/22  HM MAMMOGRAPHY  Result Value Ref Range   HM Mammogram 0-4 Bi-Rad 0-4 Bi-Rad, Self Reported Normal    Assessment & Plan    1. Chronic allergic rhinitis Improved. Continue allegra and flonase daily   2. Mild episode of depression Stable. Continue duloxetine daily   3. Rheumatoid arthritis with positive rheumatoid factor, involving unspecified site [facility]) Continue regular visits with rheumatology    Problem List Items Addressed This Visit        Respiratory   Chronic allergic rhinitis - Primary     Musculoskeletal and Integument   Rheumatoid arthritis ([place])     Other   Mild episode of depression     Return in about 6 months (around 08/12/2022) for medicare wellness, FBW a week prior to visit.         Ronnell Freshwater, NP  [facility] [phone] (phone) [phone] (fax)  [place]

## 2022-03-02 ENCOUNTER — Telehealth: Payer: Self-pay | Admitting: *Deleted

## 2022-03-02 DIAGNOSIS — M810 Age-related osteoporosis without current pathological fracture: Secondary | ICD-10-CM

## 2022-03-02 NOTE — Telephone Encounter (Addendum)
Dual coverage  Will file 1st  Annual exam 03/06/2021  Calcium 10.0              Date 03/06/2021  No Upcoming dental procedures   Is Prior Authorization not required no  Appt 05/01/22         Coverage Details:for the Secondary MD Purchase access option, this plan is a Medicare Supplement Plan G and it covers the Medicare Part B co-insurance and 100% of the excess charges. This plan does not cover the Medicare Part B deductible.

## 2022-03-06 ENCOUNTER — Encounter: Payer: Self-pay | Admitting: Obstetrics & Gynecology

## 2022-03-06 ENCOUNTER — Ambulatory Visit (INDEPENDENT_AMBULATORY_CARE_PROVIDER_SITE_OTHER): Payer: Medicare Other | Admitting: Obstetrics & Gynecology

## 2022-03-06 VITALS — BP 122/80 | HR 70 | Resp 16 | Ht 64.5 in | Wt 120.0 lb

## 2022-03-06 DIAGNOSIS — Z853 Personal history of malignant neoplasm of breast: Secondary | ICD-10-CM

## 2022-03-06 DIAGNOSIS — Z9189 Other specified personal risk factors, not elsewhere classified: Secondary | ICD-10-CM | POA: Diagnosis not present

## 2022-03-06 DIAGNOSIS — Z780 Asymptomatic menopausal state: Secondary | ICD-10-CM

## 2022-03-06 DIAGNOSIS — B009 Herpesviral infection, unspecified: Secondary | ICD-10-CM

## 2022-03-06 DIAGNOSIS — Z9289 Personal history of other medical treatment: Secondary | ICD-10-CM

## 2022-03-06 DIAGNOSIS — M810 Age-related osteoporosis without current pathological fracture: Secondary | ICD-10-CM

## 2022-03-06 DIAGNOSIS — Z01419 Encounter for gynecological examination (general) (routine) without abnormal findings: Secondary | ICD-10-CM

## 2022-03-06 NOTE — Progress Notes (Signed)
Carla Lamb 06-12-1955 [id number]   History:    67 y.o. G5P1A4L1   RP:  Established patient presenting for annual gyn exam    HPI: Postmenopause, well on no HRT.  No PMB.  No pelvic pain.  Dryness with IC, low libido.  Recommend coconut oil.  Pap 01/2020 Neg.  No indication for a Pap at this time. H/O Rt Breast Ca, s/p Rt Mastectomy. Left Mammo Neg in 09/2021.  BMD Osteoporosis in 04/2020. Repeat BD in 04/2022.  On Prolia.  BMI 20.28. Cologuard 2023.  Had a Lt hip replacement in 12/2020, needs a Rt hip replacement now. Health labs with Fam MD.  Flu vaccine declined.   Past medical history,surgical history, family history and social history were all reviewed and documented in the EPIC chart.  Gynecologic History No LMP recorded. Patient is postmenopausal.  Obstetric History OB History  Gravida Para Term Preterm AB Living  5 1 1   4 1  $ SAB IAB Ectopic Multiple Live Births  4       1    # Outcome Date GA Lbr Len/2nd Weight Sex Delivery Anes PTL Lv  5 SAB           4 SAB           3 SAB           2 SAB           1 Term              ROS: A ROS was performed and pertinent positives and negatives are included in the history. GENERAL: No fevers or chills. HEENT: No change in vision, no earache, sore throat or sinus congestion. NECK: No pain or stiffness. CARDIOVASCULAR: No chest pain or pressure. No palpitations. PULMONARY: No shortness of breath, cough or wheeze. GASTROINTESTINAL: No abdominal pain, nausea, vomiting or diarrhea, melena or bright red blood per rectum. GENITOURINARY: No urinary frequency, urgency, hesitancy or dysuria. MUSCULOSKELETAL: No joint or muscle pain, no back pain, no recent trauma. DERMATOLOGIC: No rash, no itching, no lesions. ENDOCRINE: No polyuria, polydipsia, no heat or cold intolerance. No recent change in weight. HEMATOLOGICAL: No anemia or easy bruising or bleeding. NEUROLOGIC: No headache, seizures, numbness, tingling or weakness. PSYCHIATRIC: No depression,  no loss of interest in normal activity or change in sleep pattern.     Exam:   BP 122/80   Pulse 70   Resp 16   Ht 5' 4.5" (1.638 m)   Wt 120 lb (54.4 kg)   BMI 20.28 kg/m   Body mass index is 20.28 kg/m.  General appearance : Well developed well nourished female. No acute distress HEENT: Eyes: no retinal hemorrhage or exudates,  Neck supple, trachea midline, no carotid bruits, no thyroidmegaly Lungs: Clear to auscultation, no rhonchi or wheezes, or rib retractions  Heart: Regular rate and rhythm, no murmurs or gallops Breast:Examined in sitting and supine position were symmetrical in appearance, no palpable masses or tenderness,  no skin retraction, no nipple inversion, no nipple discharge, no skin discoloration, no axillary or supraclavicular lymphadenopathy Abdomen: no palpable masses or tenderness, no rebound or guarding Extremities: no edema or skin discoloration or tenderness  Pelvic: Vulva: Normal             Vagina: No gross lesions or discharge  Cervix: No gross lesions or discharge  Uterus  AV, normal size, shape and consistency, non-tender and mobile  Adnexa  Without masses or tenderness  Anus: Normal  Assessment/Plan:  67 y.o. female for annual exam   1. Well female exam with routine gynecological exam Postmenopause, well on no HRT.  No PMB.  No pelvic pain. Dryness with IC, low libido.  Recommend coconut oil.  Pap 01/2020 Neg.  No indication for a Pap at this time. H/O Rt Breast Ca, s/p Rt Mastectomy. Left Mammo Neg in 09/2021.  BMD Osteoporosis in 04/2020. Repeat BD in 04/2022.  On Prolia.  BMI 20.28. Cologuard 2023.  Had a Lt hip replacement in 12/2020, needs a Rt hip replacement now. Health labs with Fam MD.  Flu vaccine declined.  2. Postmenopause Postmenopause, well on no HRT.  No PMB.  No pelvic pain. Dryness with IC, low libido.  Recommend coconut oil.   3. Age-related osteoporosis without current pathological fracture BMD Osteoporosis in 04/2020. Repeat BD  in 04/2022.  On Prolia.  - DG Bone Density; Future  4. Personal history of breast cancer  5. Personal history of other medical treatment  Other orders - VITAMIN D PO; Take 2,000 Int'l Units by mouth. - ASPIRIN 81 PO; Take by mouth.   Princess Bruins MD, 1:56 PM

## 2022-03-13 DIAGNOSIS — F32A Depression, unspecified: Secondary | ICD-10-CM | POA: Insufficient documentation

## 2022-03-16 ENCOUNTER — Other Ambulatory Visit: Payer: Medicare Other

## 2022-03-16 DIAGNOSIS — M810 Age-related osteoporosis without current pathological fracture: Secondary | ICD-10-CM

## 2022-03-17 LAB — CALCIUM: Calcium: 10 mg/dL (ref 8.6–10.4)

## 2022-04-15 ENCOUNTER — Encounter: Payer: Self-pay | Admitting: Family Medicine

## 2022-04-15 ENCOUNTER — Ambulatory Visit (INDEPENDENT_AMBULATORY_CARE_PROVIDER_SITE_OTHER): Payer: Medicare Other | Admitting: Family Medicine

## 2022-04-15 VITALS — BP 135/88 | HR 79 | Resp 18 | Ht 64.5 in | Wt 123.0 lb

## 2022-04-15 DIAGNOSIS — Z01818 Encounter for other preprocedural examination: Secondary | ICD-10-CM | POA: Insufficient documentation

## 2022-04-15 DIAGNOSIS — F419 Anxiety disorder, unspecified: Secondary | ICD-10-CM | POA: Diagnosis not present

## 2022-04-15 MED ORDER — DULOXETINE HCL 20 MG PO CPEP: 20.0000 mg | ORAL_CAPSULE | Freq: Every day | ORAL | 1 refills | Status: DC

## 2022-04-15 NOTE — Assessment & Plan Note (Signed)
Blood pressure initially elevated 170/91 on presentation, on recheck decreased to 135/85.  I suspect the increased blood pressure is secondary to 7 out of 10 right hip pain aggravated by driving.  Instructed patient to discontinue aspirin 5 days prior to surgery.  Otherwise, she is cleared for surgery.

## 2022-04-15 NOTE — Progress Notes (Signed)
   Acute Office Visit  Subjective:     Patient ID: Carla Lamb, female    DOB: [birth year], 67 y.o.   MRN: [id number]  Chief Complaint  Patient presents with   Pre-op Exam    HPI Patient is in today for surgical clearance.  Patient is having right hip replacement done at [facility]  She reports that today she is in 7 out of 10 pain in her right hip which has been aggravated by the drive over.  It is very rainy today and it hurt her worse than usual.  She takes blood pressures at home occasionally and they are around 135/80.  She has been taking duloxetine 20 mg daily, which has improved anxiety and pain significantly.  Review of Systems  Constitutional:  Negative for chills, fever and malaise/fatigue.  Eyes:  Negative for blurred vision and double vision.  Respiratory:  Negative for shortness of breath.   Cardiovascular:  Negative for chest pain, palpitations and leg swelling.  Gastrointestinal:  Negative for abdominal pain, constipation, diarrhea, nausea and vomiting.  Neurological:  Negative for headaches.  Endo/Heme/Allergies:  Negative for polydipsia. Does not bruise/bleed easily.  Psychiatric/Behavioral:  Negative for depression. The patient is not nervous/anxious.      Objective:    BP 135/88 (BP Location: Left Arm, Patient Position: Sitting, Cuff Size: Normal)   Pulse 79   Resp 18   Ht 5' 4.5" (1.638 m)   Wt 123 lb (55.8 kg)   SpO2 100%   BMI 20.79 kg/m   Physical Exam Constitutional:      General: She is not in acute distress.    Appearance: Normal appearance.  HENT:     Head: Normocephalic and atraumatic.  Neck:     Vascular: No carotid bruit or JVD.  Cardiovascular:     Rate and Rhythm: Normal rate and regular rhythm.     Pulses: Normal pulses.     Heart sounds: Normal heart sounds, S1 normal and S2 normal. No murmur heard.    No friction rub. No gallop.  Pulmonary:     Effort: Pulmonary effort is normal. No respiratory distress.     Breath sounds: No  wheezing, rhonchi or rales.  Musculoskeletal:     Right lower leg: No edema.     Left lower leg: No edema.  Skin:    General: Skin is warm and dry.  Neurological:     Mental Status: She is alert and oriented to person, place, and time.      Assessment & Plan:  Preoperative clearance Assessment & Plan: Blood pressure initially elevated 170/91 on presentation, on recheck decreased to 135/85.  I suspect the increased blood pressure is secondary to 7 out of 10 right hip pain aggravated by driving.  Instructed patient to discontinue aspirin 5 days prior to surgery.  Otherwise, she is cleared for surgery.   Anxiety -     DULoxetine HCl; Take 1 capsule (20 mg total) by mouth daily.  Dispense: 90 capsule; Refill: 1   Return in 17 weeks (on 08/12/2022) for Medicare annual wellness visit, fasting blood work 1 week before (already scheduled).  I spent 30 minutes on the day of the encounter to include pre-visit record review, face-to-face time with the patient and post visit ordering of test.  Velva Harman, PA

## 2022-04-15 NOTE — Patient Instructions (Signed)
We will fax your surgical clearance form over to your surgeon.  I also sent in a refill for your duloxetine.  Let me know in the meantime if you need anything else, otherwise good luck with your surgery and I will see you in July!

## 2022-04-21 ENCOUNTER — Telehealth: Payer: Self-pay

## 2022-04-21 NOTE — Telephone Encounter (Signed)
Patient called in voice mail. She is scheduled for Prolia injection 05/01/2022.    She called because she is scheduled for hip joint replacement on 05/26/22 and wondered if she needed to postpone the Prolia injection.

## 2022-04-21 NOTE — Telephone Encounter (Signed)
Carla Bruins, MD  You3 minutes ago (3:00 PM)    Yes, next Prolia after completely healed from Hip replacement per Ortho. Dr Carlean Jews

## 2022-04-21 NOTE — Telephone Encounter (Signed)
I called patient and spoke with her and advised her once orthopedist clears her "completely healed" she may call to reschedule her Prolia injection at that time.  I cancelled her Prolia injection appt.  Will route to Raquel Sarna to make her aware.

## 2022-05-01 ENCOUNTER — Ambulatory Visit: Payer: Medicare Other

## 2022-05-06 ENCOUNTER — Other Ambulatory Visit: Payer: Self-pay | Admitting: Nurse Practitioner

## 2022-05-06 DIAGNOSIS — J309 Allergic rhinitis, unspecified: Secondary | ICD-10-CM

## 2022-05-07 ENCOUNTER — Other Ambulatory Visit: Payer: Self-pay | Admitting: Nurse Practitioner

## 2022-05-07 DIAGNOSIS — J309 Allergic rhinitis, unspecified: Secondary | ICD-10-CM

## 2022-05-12 NOTE — Patient Instructions (Addendum)
SURGICAL WAITING ROOM VISITATION  Patients having surgery or a procedure may have no more than 2 support people in the waiting area - these visitors may rotate.    Children under the age of 97 must have an adult with them who is not the patient.  Due to an increase in RSV and influenza rates and associated hospitalizations, children ages 53 and under may not visit patients in [facility] hospitals.  If the patient needs to stay at the hospital during part of their recovery, the visitor guidelines for inpatient rooms apply. Pre-op nurse will coordinate an appropriate time for 1 support person to accompany patient in pre-op.  This support person may not rotate.    Please refer to the [facility] website for the visitor guidelines for Inpatients (after your surgery is over and you are in a regular room).     Your procedure is scheduled on: 05/26/22   Report to [facility] Main Entrance    Report to admitting at 5:30 AM   Call this number if you have problems the morning of surgery [phone]   Do not eat food :After Midnight.   After Midnight you may have the following liquids until 5:00 AM DAY OF SURGERY  Water Non-Citrus Juices (without pulp, NO RED-Apple, White grape, White cranberry) Black Coffee (NO MILK/CREAM OR CREAMERS, sugar ok)  Clear Tea (NO MILK/CREAM OR CREAMERS, sugar ok) regular and decaf                             Plain Jell-O (NO RED)                                           Fruit ices (not with fruit pulp, NO RED)                                     Popsicles (NO RED)                                                               Sports drinks like Gatorade (NO RED)    The day of surgery:  Drink ONE (1) Pre-Surgery Clear Ensure at 5:00 AM the morning of surgery. Drink in one sitting. Do not sip.  This drink was given to you during your hospital  pre-op appointment visit. Nothing else to drink after completing the Pre-Surgery Clear Ensure.          If  you have questions, please contact your surgeon's office.   FOLLOW  ANY ADDITIONAL PRE OP INSTRUCTIONS YOU RECEIVED FROM YOUR SURGEON'S OFFICE!!!     Oral Hygiene is also important to reduce your risk of infection.                                    Remember - BRUSH YOUR TEETH THE MORNING OF SURGERY WITH YOUR REGULAR TOOTHPASTE  DENTURES WILL BE REMOVED PRIOR TO SURGERY PLEASE DO NOT APPLY "Poly grip" OR ADHESIVES!!!  Do NOT smoke after Midnight   Take these medicines the morning of surgery with A SIP OF WATER:  Tylenol Flonase nasal spray                              You may not have any metal on your body including hair pins, jewelry, and body piercing             Do not wear make-up, lotions, powders, perfumes, or deodorant  Do not wear nail polish including gel and S&S, artificial/acrylic nails, or any other type of covering on natural nails including finger and toenails. If you have artificial nails, gel coating, etc. that needs to be removed by a nail salon please have this removed prior to surgery or surgery may need to be canceled/ delayed if the surgeon/ anesthesia feels like they are unable to be safely monitored.   Do not shave  48 hours prior to surgery.    Do not bring valuables to the hospital. [facility] IS NOT  RESPONSIBLE   FOR VALUABLES.   Contacts, glasses, dentures or bridgework may not be worn into surgery.  DO NOT BRING YOUR HOME MEDICATIONS TO THE HOSPITAL. PHARMACY WILL DISPENSE MEDICATIONS LISTED ON YOUR MEDICATION LIST TO YOU DURING YOUR ADMISSION IN THE HOSPITAL!    Patients discharged on the day of surgery will not be allowed to drive home.  Someone NEEDS to stay with you for the first 24 hours after anesthesia.              Please read over the following fact sheets you were given: IF YOU HAVE QUESTIONS ABOUT YOUR PRE-OP INSTRUCTIONS PLEASE CALL [phone] Gwen   If you received a COVID test during your pre-op visit  it is requested that you wear  a mask when out in public, stay away from anyone that may not be feeling well and notify your surgeon if you develop symptoms. If you test positive for Covid or have been in contact with anyone that has tested positive in the last 10 days please notify you surgeon.    Incentive Spirometer  An incentive spirometer is a tool that can help keep your lungs clear and active. This tool measures how well you are filling your lungs with each breath. Taking long deep breaths may help reverse or decrease the chance of developing breathing (pulmonary) problems (especially infection) following: A long period of time when you are unable to move or be active. BEFORE THE PROCEDURE  If the spirometer includes an indicator to show your best effort, your nurse or respiratory therapist will set it to a desired goal. If possible, sit up straight or lean slightly forward. Try not to slouch. Hold the incentive spirometer in an upright position. INSTRUCTIONS FOR USE  Sit on the edge of your bed if possible, or sit up as far as you can in bed or on a chair. Hold the incentive spirometer in an upright position. Breathe out normally. Place the mouthpiece in your mouth and seal your lips tightly around it. Breathe in slowly and as deeply as possible, raising the piston or the ball toward the top of the column. Hold your breath for 3-5 seconds or for as long as possible. Allow the piston or ball to fall to the bottom of the column. Remove the mouthpiece from your mouth and breathe out normally. Rest for a few seconds and repeat Steps 1 through 7 at  least 10 times every 1-2 hours when you are awake. Take your time and take a few normal breaths between deep breaths. The spirometer may include an indicator to show your best effort. Use the indicator as a goal to work toward during each repetition. After each set of 10 deep breaths, practice coughing to be sure your lungs are clear. If you have an incision (the cut made at  the time of surgery), support your incision when coughing by placing a pillow or rolled up towels firmly against it. Once you are able to get out of bed, walk around indoors and cough well. You may stop using the incentive spirometer when instructed by your caregiver.  RISKS AND COMPLICATIONS Take your time so you do not get dizzy or light-headed. If you are in pain, you may need to take or ask for pain medication before doing incentive spirometry. It is harder to take a deep breath if you are having pain. AFTER USE Rest and breathe slowly and easily. It can be helpful to keep track of a log of your progress. Your caregiver can provide you with a simple table to help with this. If you are using the spirometer at home, follow these instructions: SEEK MEDICAL CARE IF:  You are having difficultly using the spirometer. You have trouble using the spirometer as often as instructed. Your pain medication is not giving enough relief while using the spirometer. You develop fever of 100.5 F (38.1 C) or higher. SEEK IMMEDIATE MEDICAL CARE IF:  You cough up bloody sputum that had not been present before. You develop fever of 102 F (38.9 C) or greater. You develop worsening pain at or near the incision site. MAKE SURE YOU:  Understand these instructions. Will watch your condition. Will get help right away if you are not doing well or get worse. Document Released: 05/18/2006 Document Revised: 03/30/2011 Document Reviewed: 07/19/2006 ExitCare Patient Information 2014 Exit[employer], [place].   ________________________________________________________________________   WHAT IS A BLOOD TRANSFUSION? Blood Transfusion Information  A transfusion is the replacement of blood or some of its parts. Blood is made up of multiple cells which provide different functions. Red blood cells carry oxygen and are used for blood loss replacement. White blood cells fight against infection. Platelets control  bleeding. Plasma helps clot blood. Other blood products are available for specialized needs, such as hemophilia or other clotting disorders. BEFORE THE TRANSFUSION  Who gives blood for transfusions?  Healthy volunteers who are fully evaluated to make sure their blood is safe. This is blood bank blood. Transfusion therapy is the safest it has ever been in the practice of medicine. Before blood is taken from a donor, a complete history is taken to make sure that person has no history of diseases nor engages in risky social behavior (examples are intravenous drug use or sexual activity with multiple partners). The donor's travel history is screened to minimize risk of transmitting infections, such as malaria. The donated blood is tested for signs of infectious diseases, such as HIV and hepatitis. The blood is then tested to be sure it is compatible with you in order to minimize the chance of a transfusion reaction. If you or a relative donates blood, this is often done in anticipation of surgery and is not appropriate for emergency situations. It takes many days to process the donated blood. RISKS AND COMPLICATIONS Although transfusion therapy is very safe and saves many lives, the main dangers of transfusion include:  Getting an infectious disease. Developing a  transfusion reaction. This is an allergic reaction to something in the blood you were given. Every precaution is taken to prevent this. The decision to have a blood transfusion has been considered carefully by your caregiver before blood is given. Blood is not given unless the benefits outweigh the risks. AFTER THE TRANSFUSION Right after receiving a blood transfusion, you will usually feel much better and more energetic. This is especially true if your red blood cells have gotten low (anemic). The transfusion raises the level of the red blood cells which carry oxygen, and this usually causes an energy increase. The nurse administering the  transfusion will monitor you carefully for complications. HOME CARE INSTRUCTIONS  No special instructions are needed after a transfusion. You may find your energy is better. Speak with your caregiver about any limitations on activity for underlying diseases you may have. SEEK MEDICAL CARE IF:  Your condition is not improving after your transfusion. You develop redness or irritation at the intravenous (IV) site. SEEK IMMEDIATE MEDICAL CARE IF:  Any of the following symptoms occur over the next 12 hours: Shaking chills. You have a temperature by mouth above 102 F (38.9 C), not controlled by medicine. Chest, back, or muscle pain. People around you feel you are not acting correctly or are confused. Shortness of breath or difficulty breathing. Dizziness and fainting. You get a rash or develop hives. You have a decrease in urine output. Your urine turns a dark color or changes to pink, red, or brown. Any of the following symptoms occur over the next 10 days: You have a temperature by mouth above 102 F (38.9 C), not controlled by medicine. Shortness of breath. Weakness after normal activity. The white part of the eye turns yellow (jaundice). You have a decrease in the amount of urine or are urinating less often. Your urine turns a dark color or changes to pink, red, or brown. Document Released: 01/03/2000 Document Revised: 03/30/2011 Document Reviewed: 08/22/2007 [facility] Patient Information 2014 [place], [place].

## 2022-05-13 NOTE — Progress Notes (Addendum)
COVID Vaccine Completed: No  Date of COVID positive in last 90 days:  No  PCP - Mayer Masker, PA-C Cardiologist - N/A  Medical clearance in Epic dated 04-15-22 by Saralyn Pilar, PA  Chest x-ray - N/A EKG - 05-15-22 Epic Stress Test - N/A ECHO - N/A Cardiac Cath - N/A Pacemaker/ICD device last checked: Spinal Cord Stimulator:N/A  Bowel Prep - N/A  Sleep Study - N/A CPAP -   Fasting Blood Sugar - N/A Checks Blood Sugar _____ times a day  Last dose of GLP1 agonist-  N/A GLP1 instructions:  N/A   Last dose of SGLT-2 inhibitors-  N/A SGLT-2 instructions: N/A  Blood Thinner Instructions:  Time Aspirin Instructions:  ASA 81.  To stop a week before per patient Last Dose:  Activity level:  Can go up a flight of stairs and perform activities of daily living without stopping and without symptoms of chest pain or shortness of breath.  Anesthesia review: N/A  Patient denies shortness of breath, fever, cough and chest pain at PAT appointment  Patient verbalized understanding of instructions that were given to them at the PAT appointment. Patient was also instructed that they will need to review over the PAT instructions again at home before surgery.

## 2022-05-15 ENCOUNTER — Encounter (HOSPITAL_COMMUNITY)
Admission: RE | Admit: 2022-05-15 | Discharge: 2022-05-15 | Disposition: A | Discharge status: Home / Self Care | Payer: Medicare Other | Source: Ambulatory Visit | Attending: Orthopedic Surgery | Admitting: Orthopedic Surgery

## 2022-05-15 ENCOUNTER — Encounter (HOSPITAL_COMMUNITY): Payer: Self-pay

## 2022-05-15 ENCOUNTER — Other Ambulatory Visit: Payer: Self-pay

## 2022-05-15 VITALS — BP 143/93 | HR 89 | Temp 97.8°F | Resp 12 | Ht 65.0 in | Wt 123.0 lb

## 2022-05-15 DIAGNOSIS — K760 Fatty (change of) liver, not elsewhere classified: Secondary | ICD-10-CM | POA: Diagnosis not present

## 2022-05-15 DIAGNOSIS — I10 Essential (primary) hypertension: Secondary | ICD-10-CM | POA: Diagnosis not present

## 2022-05-15 DIAGNOSIS — Z01818 Encounter for other preprocedural examination: Secondary | ICD-10-CM | POA: Diagnosis not present

## 2022-05-15 HISTORY — DX: Family history of other specified conditions: Z84.89

## 2022-05-15 LAB — COMPREHENSIVE METABOLIC PANEL
ALT: 39 U/L (ref 0–44)
AST: 30 U/L (ref 15–41)
Albumin: 4.1 g/dL (ref 3.5–5.0)
Alkaline Phosphatase: 63 U/L (ref 38–126)
Anion gap: 9 (ref 5–15)
BUN: 15 mg/dL (ref 8–23)
CO2: 22 mmol/L (ref 22–32)
Calcium: 9.3 mg/dL (ref 8.9–10.3)
Chloride: 104 mmol/L (ref 98–111)
Creatinine, Ser: 0.77 mg/dL (ref 0.44–1.00)
GFR, Estimated: >60 mL/min (ref >60)
Glucose, Bld: 94 mg/dL (ref 70–99)
Potassium: 3.8 mmol/L (ref 3.5–5.1)
Sodium: 135 mmol/L (ref 135–145)
Total Bilirubin: 0.7 mg/dL (ref 0.3–1.2)
Total Protein: 7 g/dL (ref 6.5–8.1)

## 2022-05-15 LAB — CBC
HCT: 42.1 % (ref 36.0–46.0)
Hemoglobin: 13.7 g/dL (ref 12.0–15.0)
MCH: 33.1 pg (ref 26.0–34.0)
MCHC: 32.5 g/dL (ref 30.0–36.0)
MCV: 101.7 fL — ABNORMAL HIGH (ref 80.0–100.0)
Platelets: 314 10*3/uL (ref 150–400)
RBC: 4.14 MIL/uL (ref 3.87–5.11)
RDW: 13.2 % (ref 11.5–15.5)
WBC: 7.1 10*3/uL (ref 4.0–10.5)
nRBC: 0 % (ref 0.0–0.2)

## 2022-05-15 LAB — TYPE AND SCREEN
ABO/RH(D): O POS
Antibody Screen: NEGATIVE

## 2022-05-15 LAB — SURGICAL PCR SCREEN
MRSA, PCR: NEGATIVE
Staphylococcus aureus: NEGATIVE

## 2022-05-18 NOTE — H&P (Signed)
HIP ARTHROPLASTY ADMISSION H&P  Patient ID: Carla Lamb MRN: [id number] DOB/AGE: Jun 27, 1955 67 y.o.  Chief Complaint: {Left/right:3049041} hip pain.  Planned Procedure Date: *** Medical Clearance by ***   Cardiac Clearance by *** Additional clearance by ***   HPI: Carla Lamb is a 67 y.o. female who presents for evaluation of OA RIGHT HIP. The patient has a history of pain and functional disability in the {Left/right:3049041} hip due to arthritis and has failed non-surgical conservative treatments for greater than 12 weeks to include {nonsurgical conservative treatment (must select two):3049030}.  Onset of symptoms was {abrupt, gradual:20671}, starting {1->10 years:3049031} years ago with {stable, gradual worsening, rapidly worsening:3049032} course since that time. The patient noted {no past surgery, prior procedures:3049033} on the {Left/right:3049041} hip.  Patient currently rates pain at {1-10:3049035} out of 10 with activity. Patient has {night pain, worsening of pain with activity and weight bearing:3049036}.  Patient has evidence of {Radiographic or MRI evidence of (must document one of the below):3046104} by imaging studies.  There is no active infection.  Past Medical History:  Diagnosis Date   Anxiety    Bursitis    Cancer (HCC)    Right Mastectomy Padgets Disease   Depression    During treament   Family history of adverse reaction to anesthesia    Mother and sister hard to wake up   Fractured elbow    & forearm   Fractured pelvis (HCC)    HSV-1 infection    Osteoporosis 01/2017   2017 T score -2.5 2019 T score -2.3.   RA (rheumatoid arthritis) [facility])    Past Surgical History:  Procedure Laterality Date   ANTERIOR CERVICAL DECOMP/DISCECTOMY FUSION  08/25/2011   Procedure: ANTERIOR CERVICAL DECOMPRESSION/DISCECTOMY FUSION 3 LEVELS;  Surgeon: Karn Cassis, MD;  Location: [facility]UR[facility] ORS;  Service: Neurosurgery;  Laterality: N/A;  Anterior Cervical  Decompression/Discectomy Fusion Cervical Four-Five Cervical Six Corpectomy, Fusion to cervical Seven.   Arm surgery Left 09/08/2021   bunions     bilat feet   CERVICAL FUSION  08/25/2011   C 4  5 & 6   MASTECTOMY  01/20/2004   Right   TOTAL HIP ARTHROPLASTY Left 12/31/2020   Procedure: TOTAL HIP ARTHROPLASTY ANTERIOR APPROACH;  Surgeon: Sheral Apley, MD;  Location: [facility] ORS;  Service: Orthopedics;  Laterality: Left;   Vaginal Cyst Removed  01/20/2008   No Known Allergies Prior to Admission medications   Medication Sig Start Date End Date Taking? Authorizing Provider  acetaminophen (TYLENOL) 500 MG tablet Take 2 tablets (1,000 mg total) by mouth every 6 (six) hours as needed for mild pain or moderate pain. 12/31/20  Yes Jenne Pane, PA-C  aspirin EC 81 MG tablet Take 81 mg by mouth every evening.   Yes [provider]  cholecalciferol (VITAMIN D3) 25 MCG (1000 UNIT) tablet Take 1,000 Units by mouth every evening.   Yes [provider]  denosumab (PROLIA) 60 MG/ML SOSY injection Inject 60 mg into the skin every 6 (six) months.   Yes [provider]  DULoxetine (CYMBALTA) 20 MG capsule Take 1 capsule (20 mg total) by mouth daily. Patient taking differently: Take 20 mg by mouth every evening. 04/15/22  Yes Saralyn Pilar A, PA  fexofenadine (ALLEGRA) 180 MG tablet TAKE 1 TABLET BY MOUTH EVERY DAY Patient taking differently: Take 180 mg by mouth every evening. 05/06/22  Yes Boscia, Heather E, NP  fluticasone (FLONASE) 50 MCG/ACT nasal spray SPRAY 2 SPRAYS INTO EACH NOSTRIL EVERY DAY  Patient taking differently: Place 1 spray into both nostrils every evening. 05/07/22  Yes Saralyn Pilar A, PA  folic acid (FOLVITE) 1 MG tablet Take 3 mg by mouth every evening.   Yes [provider]  Golimumab (SIMPONI ARIA IV) Inject 1 Dose into the vein every 8 (eight) weeks.   Yes [provider]  hydroxychloroquine (PLAQUENIL) 200 MG tablet Take 400 mg by  mouth every evening.   Yes [provider]  LIDOCAINE EX Apply 1 spray topically 3 (three) times daily as needed (pain.).   Yes [provider]  Lidocaine HCl (LIDAFLEX) 4 % PTCH Place 1 patch onto the skin 2 (two) times daily as needed (pain.).   Yes [provider]  methotrexate (RHEUMATREX) 2.5 MG tablet Take 15 mg by mouth every Monday.  Caution:Chemotherapy. Protect from light.   Yes [provider]   Social History   Socioeconomic History   Marital status: Married    Spouse name: Maisie Fus   Number of children: 1   Years of education: 12th   Highest education level: Not on file  Occupational History   Not on file  Tobacco Use   Smoking status: Every Day    Packs/day: 0.25    Years: 10.00    Additional pack years: 0.00    Total pack years: 2.50    Types: Cigarettes    Passive exposure: Never   Smokeless tobacco: Never  Vaping Use   Vaping Use: Never used  Substance and Sexual Activity   Alcohol use: Not Currently   Drug use: Never   Sexual activity: Not Currently    Partners: Male    Birth control/protection: Post-menopausal, Abstinence    Comment: 1st intercourse 67 yo-Fewer than 5 partners  Other Topics Concern   Not on file  Social History Narrative   Not on file   Social Determinants of Health   Financial Resource Strain: Low Risk  (05/21/2021)   Overall Financial Resource Strain (CARDIA)    Difficulty of Paying Living Expenses: Not very hard  Food Insecurity: No Food Insecurity (05/21/2021)   Hunger Vital Sign    Worried About Running Out of Food in the Last Year: Never true    Ran Out of Food in the Last Year: Never true  Transportation Needs: No Transportation Needs (05/21/2021)   PRAPARE - Administrator, Civil Service (Medical): No    Lack of Transportation (Non-Medical): No  Physical Activity: Inactive (05/21/2021)   Exercise Vital Sign    Days of Exercise per Week: 0 days    Minutes of Exercise per Session: 0  min  Stress: Stress Concern Present (05/21/2021)   [employer] of Occupational Health - Occupational Stress Questionnaire    Feeling of Stress : Very much  Social Connections: Moderately Isolated (05/21/2021)   Social Connection and Isolation Panel [NHANES]    Frequency of Communication with Friends and Family: More than three times a week    Frequency of Social Gatherings with Friends and Family: Once a week    Attends Religious Services: Never    Database administrator or Organizations: No    Attends Banker Meetings: Never    Marital Status: Married   Family History  Problem Relation Age of Onset   Cancer Father        LUNG- SMOKER   Cancer Brother 57       RENAL   Breast cancer Mother 7   Hypertension Mother    Cancer  Maternal Grandfather        Colon   Diverticulitis Sister     ROS: Currently denies lightheadedness, dizziness, Fever, chills, CP, SOB.***   No personal history of DVT, PE, MI, or CVA.*** No loose teeth or dentures*** All other systems have been reviewed and were otherwise currently negative with the exception of those mentioned in the HPI and as above.  Objective: Vitals: Ht: *** Wt: *** lbs Temp: *** BP: *** Pulse: *** O2 ***% on room air.   Physical Exam: General: Alert, NAD. Trendelenberg Gait *** HEENT: EOMI, Good Neck Extension *** Pulm: No increased work of breathing.  Clear B/L A/P w/o crackle or wheeze. *** CV: RRR, No m/g/r appreciated *** GI: soft, NT, ND. BS x 4 quadrants Neuro: CN II-XII grossly intact without focal deficit.  Sensation intact distally Skin: No lesions in the area of chief complaint MSK/Surgical Site: *** TTP. Hip pain with ROM. + Stinchfield. + SLR. + FABER/FADIR. 5/5 strength.  NVI.    Imaging Review Plain radiographs demonstrate {mild/mod/severe:3049053} degenerative joint disease of the {left/right/bi:30031} hip.   The bone quality appears to be {good/fair/poor/excellent:33178} for age and reported  activity level.  Preoperative templating of the joint replacement has been completed, documented, and submitted to the Operating Room personnel in order to optimize intra-operative equipment management.  Assessment: OA RIGHT HIP Active Problems:   * No active hospital problems. *   Plan: Plan for Procedure(s): TOTAL HIP ARTHROPLASTY ANTERIOR APPROACH  The patient history, physical exam, clinical judgement of the provider and imaging are consistent with end stage degenerative joint disease and total joint arthroplasty is deemed medically necessary. The treatment options including medical management, injection therapy, and arthroplasty were discussed at length. The risks and benefits of Procedure(s): TOTAL HIP ARTHROPLASTY ANTERIOR APPROACH were presented and reviewed.  The risks of nonoperative treatment, versus surgical intervention including but not limited to continued pain, aseptic loosening, stiffness, dislocation/subluxation, infection, bleeding, nerve injury, blood clots, cardiopulmonary complications, morbidity, mortality, among others were discussed. The patient verbalizes understanding and wishes to proceed with the plan.  Patient is being admitted for surgery, pain control, PT, prophylactic antibiotics, VTE prophylaxis, progressive ambulation, ADL's and discharge planning.   Dental prophylaxis discussed and recommended for 2 years postoperatively.***  The patient does not*** meet the criteria for TXA which will be used perioperatively.   ASA 81 mg BID ***ASA 325 mg ***Xarelto  will be used postoperatively for DVT prophylaxis in addition to SCDs, and early ambulation. Plan for Oxycodone***, Celebrex, Tylenol for pain.   Robaxin***Baclofen for muscle spasm.  Zofran for nausea and vomiting. ***Omeprazole for gastric protection. Pharmacy- *** The patient is planning to be discharged home with OPPT*** HHPT*** ([facility]***) and into the care of *** who can be reached at *** Follow up  appt ***  {ORTHOADMISSIONSTATUS:21269}   Marzetta Board Office [phone] 05/18/2022 3:51 PM

## 2022-05-22 NOTE — Care Plan (Signed)
Ortho Bundle Case Management Note  Patient Details  Name: Carla Lamb MRN: [id number] Date of Birth: 01-27-1955   met with patient and husband in the office for H&P. She will discharge to home with family to assist. rolling walker ordered. OPPT set up with [facility]. discharge instructions discussed and questions answered. Patient and MD in agreement with plan. Choice offered.                   DME Arranged:  Dan Humphreys rolling DME Agency:  [facility]  HH Arranged:    HH Agency:     Additional Comments: Please contact me with any questions of if this plan should need to change.  Shauna Hugh,  RN,BSN,MHA,CCM  [facility] Orthopaedic Specialist  [phone] 05/22/2022, 1:23 PM

## 2022-05-25 NOTE — Anesthesia Preprocedure Evaluation (Signed)
Anesthesia Evaluation  Patient identified by MRN, date of birth, ID band Patient awake    Reviewed: Allergy & Precautions, H&P , NPO status , Patient's Chart, lab work & pertinent test results  Airway Mallampati: II  TM Distance: >3 FB Neck ROM: Full    Dental no notable dental hx. (+) Teeth Intact, Dental Advisory Given   Pulmonary Current Smoker and Patient abstained from smoking.   Pulmonary exam normal breath sounds clear to auscultation       Cardiovascular Exercise Tolerance: Good negative cardio ROS Normal cardiovascular exam Rhythm:Regular Rate:Normal     Neuro/Psych  PSYCHIATRIC DISORDERS Anxiety     negative neurological ROS  negative psych ROS   GI/Hepatic negative GI ROS, Neg liver ROS,,,  Endo/Other  negative endocrine ROS    Renal/GU negative Renal ROSLab Results      Component                Value               Date                      CREATININE               0.77                05/15/2022                K                        3.8                 05/15/2022               negative genitourinary   Musculoskeletal negative musculoskeletal ROS (+) Arthritis , Rheumatoid disorders,    Abdominal   Peds negative pediatric ROS (+)  Hematology negative hematology ROS (+) Lab Results      Component                Value               Date                      WBC                      7.1                 05/15/2022                HGB                      13.7                05/15/2022                HCT                      42.1                05/15/2022                   PLT                      314                 05/15/2022  Anesthesia Other Findings R Breast CA  Reproductive/Obstetrics negative OB ROS                              Anesthesia Physical Anesthesia Plan  ASA: 2  Anesthesia Plan: Spinal   Post-op Pain Management: Minimal or no pain anticipated    Induction:   PONV Risk Score and Plan: 1 and Treatment may vary due to age or medical condition, Ondansetron and Midazolam  Airway Management Planned: Natural Airway and Nasal Cannula  Additional Equipment: None  Intra-op Plan:   Post-operative Plan:   Informed Consent: I have reviewed the patients History and Physical, chart, labs and discussed the procedure including the risks, benefits and alternatives for the proposed anesthesia with the patient or authorized representative who has indicated his/her understanding and acceptance.     Dental advisory given  Plan Discussed with: CRNA and Anesthesiologist  Anesthesia Plan Comments:         Anesthesia Quick Evaluation

## 2022-05-26 ENCOUNTER — Ambulatory Visit (HOSPITAL_COMMUNITY): Payer: Medicare Other | Admitting: Anesthesiology

## 2022-05-26 ENCOUNTER — Encounter (HOSPITAL_COMMUNITY)
Admission: RE | Disposition: A | Discharge status: Home / Self Care | Payer: Self-pay | Source: Home / Self Care | Attending: Orthopedic Surgery

## 2022-05-26 ENCOUNTER — Ambulatory Visit (HOSPITAL_COMMUNITY): Payer: Medicare Other

## 2022-05-26 ENCOUNTER — Ambulatory Visit (HOSPITAL_BASED_OUTPATIENT_CLINIC_OR_DEPARTMENT_OTHER): Payer: Medicare Other | Admitting: Anesthesiology

## 2022-05-26 ENCOUNTER — Encounter (HOSPITAL_COMMUNITY): Payer: Self-pay | Admitting: Orthopedic Surgery

## 2022-05-26 ENCOUNTER — Ambulatory Visit (HOSPITAL_COMMUNITY)
Admission: RE | Admit: 2022-05-26 | Discharge: 2022-05-26 | Disposition: A | Discharge status: Home / Self Care | Payer: Medicare Other | Attending: Orthopedic Surgery | Admitting: Orthopedic Surgery

## 2022-05-26 ENCOUNTER — Other Ambulatory Visit: Payer: Self-pay

## 2022-05-26 DIAGNOSIS — F419 Anxiety disorder, unspecified: Secondary | ICD-10-CM | POA: Insufficient documentation

## 2022-05-26 DIAGNOSIS — F17210 Nicotine dependence, cigarettes, uncomplicated: Secondary | ICD-10-CM | POA: Diagnosis not present

## 2022-05-26 DIAGNOSIS — Z96642 Presence of left artificial hip joint: Secondary | ICD-10-CM | POA: Diagnosis not present

## 2022-05-26 DIAGNOSIS — M1611 Unilateral primary osteoarthritis, right hip: Secondary | ICD-10-CM

## 2022-05-26 DIAGNOSIS — M069 Rheumatoid arthritis, unspecified: Secondary | ICD-10-CM | POA: Insufficient documentation

## 2022-05-26 DIAGNOSIS — Z79899 Other long term (current) drug therapy: Secondary | ICD-10-CM | POA: Insufficient documentation

## 2022-05-26 DIAGNOSIS — Z981 Arthrodesis status: Secondary | ICD-10-CM | POA: Insufficient documentation

## 2022-05-26 DIAGNOSIS — F32A Depression, unspecified: Secondary | ICD-10-CM | POA: Diagnosis not present

## 2022-05-26 DIAGNOSIS — Z96641 Presence of right artificial hip joint: Secondary | ICD-10-CM | POA: Insufficient documentation

## 2022-05-26 DIAGNOSIS — I10 Essential (primary) hypertension: Secondary | ICD-10-CM

## 2022-05-26 HISTORY — PX: HIP SURGERY: SHX245

## 2022-05-26 HISTORY — PX: TOTAL HIP ARTHROPLASTY: SHX124

## 2022-05-26 SURGERY — ARTHROPLASTY, HIP, TOTAL, ANTERIOR APPROACH
Anesthesia: Spinal | Site: Hip | Laterality: Right

## 2022-05-26 MED ORDER — LACTATED RINGERS IV BOLUS: 250.0000 mL | Freq: Once | INTRAVENOUS | Status: DC

## 2022-05-26 MED ORDER — ONDANSETRON HCL 4 MG/2ML IJ SOLN
INTRAMUSCULAR | Status: DC | PRN
  Administered 2022-05-26: 4 mg via INTRAVENOUS

## 2022-05-26 MED ORDER — LACTATED RINGERS IV BOLUS: 500.0000 mL | Freq: Once | INTRAVENOUS | Status: DC

## 2022-05-26 MED ORDER — WATER FOR IRRIGATION, STERILE IR SOLN
Status: DC | PRN
  Administered 2022-05-26: 2000 mL

## 2022-05-26 MED ORDER — MIDAZOLAM HCL 2 MG/2ML IJ SOLN
INTRAMUSCULAR | Status: AC
  Filled 2022-05-26: qty 2

## 2022-05-26 MED ORDER — HYDROMORPHONE HCL 2 MG PO TABS: 2.0000 mg | ORAL_TABLET | ORAL | Status: DC | PRN

## 2022-05-26 MED ORDER — LACTATED RINGERS IV SOLN: INTRAVENOUS | Status: DC

## 2022-05-26 MED ORDER — BUPIVACAINE LIPOSOME 1.3 % IJ SUSP
INTRAMUSCULAR | Status: DC | PRN
  Administered 2022-05-26: 20 mL

## 2022-05-26 MED ORDER — PROPOFOL 1000 MG/100ML IV EMUL
INTRAVENOUS | Status: AC
  Filled 2022-05-26: qty 100

## 2022-05-26 MED ORDER — ONDANSETRON HCL 4 MG/2ML IJ SOLN: 4.0000 mg | Freq: Four times a day (QID) | INTRAMUSCULAR | Status: DC | PRN

## 2022-05-26 MED ORDER — PHENYLEPHRINE HCL (PRESSORS) 10 MG/ML IV SOLN
INTRAVENOUS | Status: AC
  Filled 2022-05-26: qty 1

## 2022-05-26 MED ORDER — BUPIVACAINE IN DEXTROSE 0.75-8.25 % IT SOLN
INTRATHECAL | Status: DC | PRN
  Administered 2022-05-26: 1.6 mL via INTRATHECAL

## 2022-05-26 MED ORDER — ASPIRIN 81 MG PO TBEC: 81.0000 mg | DELAYED_RELEASE_TABLET | Freq: Two times a day (BID) | ORAL | 0 refills | Status: DC

## 2022-05-26 MED ORDER — PROPOFOL 10 MG/ML IV BOLUS
INTRAVENOUS | Status: DC | PRN
  Administered 2022-05-26: 20 mg via INTRAVENOUS

## 2022-05-26 MED ORDER — CEFAZOLIN SODIUM-DEXTROSE 2-4 GM/100ML-% IV SOLN
2.0000 g | INTRAVENOUS | Status: AC
  Administered 2022-05-26: 2 g via INTRAVENOUS
  Filled 2022-05-26: qty 100

## 2022-05-26 MED ORDER — FENTANYL CITRATE PF 50 MCG/ML IJ SOSY: 25.0000 ug | PREFILLED_SYRINGE | INTRAMUSCULAR | Status: DC | PRN

## 2022-05-26 MED ORDER — ACETAMINOPHEN 325 MG PO TABS: 325.0000 mg | ORAL_TABLET | Freq: Four times a day (QID) | ORAL | Status: DC | PRN

## 2022-05-26 MED ORDER — ACETAMINOPHEN 500 MG PO TABS: 1000.0000 mg | ORAL_TABLET | Freq: Four times a day (QID) | ORAL | Status: DC

## 2022-05-26 MED ORDER — ORAL CARE MOUTH RINSE: 15.0000 mL | Freq: Once | OROMUCOSAL | Status: AC

## 2022-05-26 MED ORDER — TRANEXAMIC ACID-NACL 1000-0.7 MG/100ML-% IV SOLN
1000.0000 mg | INTRAVENOUS | Status: AC
  Administered 2022-05-26: 1000 mg via INTRAVENOUS
  Filled 2022-05-26: qty 100

## 2022-05-26 MED ORDER — CELECOXIB 200 MG PO CAPS: 200.0000 mg | ORAL_CAPSULE | Freq: Two times a day (BID) | ORAL | 0 refills | Status: DC | PRN

## 2022-05-26 MED ORDER — METOCLOPRAMIDE HCL 5 MG/ML IJ SOLN: 5.0000 mg | Freq: Three times a day (TID) | INTRAMUSCULAR | Status: DC | PRN

## 2022-05-26 MED ORDER — MIDAZOLAM HCL 2 MG/2ML IJ SOLN
INTRAMUSCULAR | Status: DC | PRN
  Administered 2022-05-26: 2 mg via INTRAVENOUS

## 2022-05-26 MED ORDER — PROPOFOL 10 MG/ML IV BOLUS
INTRAVENOUS | Status: AC
  Filled 2022-05-26: qty 20

## 2022-05-26 MED ORDER — ONDANSETRON HCL 4 MG/2ML IJ SOLN: 4.0000 mg | Freq: Once | INTRAMUSCULAR | Status: DC | PRN

## 2022-05-26 MED ORDER — BUPIVACAINE LIPOSOME 1.3 % IJ SUSP
INTRAMUSCULAR | Status: AC
  Filled 2022-05-26: qty 20

## 2022-05-26 MED ORDER — METOCLOPRAMIDE HCL 5 MG PO TABS: 5.0000 mg | ORAL_TABLET | Freq: Three times a day (TID) | ORAL | Status: DC | PRN

## 2022-05-26 MED ORDER — PROPOFOL 500 MG/50ML IV EMUL
INTRAVENOUS | Status: DC | PRN
  Administered 2022-05-26: 100 ug/kg/min via INTRAVENOUS

## 2022-05-26 MED ORDER — TRANEXAMIC ACID-NACL 1000-0.7 MG/100ML-% IV SOLN
1000.0000 mg | Freq: Once | INTRAVENOUS | Status: AC
  Administered 2022-05-26: 1000 mg via INTRAVENOUS

## 2022-05-26 MED ORDER — ACETAMINOPHEN 10 MG/ML IV SOLN: 1000.0000 mg | Freq: Once | INTRAVENOUS | Status: DC | PRN

## 2022-05-26 MED ORDER — METHOCARBAMOL 500 MG PO TABS: 500.0000 mg | ORAL_TABLET | Freq: Four times a day (QID) | ORAL | Status: DC | PRN

## 2022-05-26 MED ORDER — CHLORHEXIDINE GLUCONATE 0.12 % MT SOLN
15.0000 mL | Freq: Once | OROMUCOSAL | Status: AC
  Administered 2022-05-26: 15 mL via OROMUCOSAL

## 2022-05-26 MED ORDER — CEFAZOLIN SODIUM-DEXTROSE 1-4 GM/50ML-% IV SOLN
1.0000 g | Freq: Four times a day (QID) | INTRAVENOUS | Status: DC
  Administered 2022-05-26: 1 g via INTRAVENOUS

## 2022-05-26 MED ORDER — CEFAZOLIN SODIUM-DEXTROSE 1-4 GM/50ML-% IV SOLN
INTRAVENOUS | Status: AC
  Filled 2022-05-26: qty 50

## 2022-05-26 MED ORDER — TRAMADOL HCL 50 MG PO TABS
50.0000 mg | ORAL_TABLET | Freq: Four times a day (QID) | ORAL | Status: DC
  Administered 2022-05-26: 50 mg via ORAL

## 2022-05-26 MED ORDER — 0.9 % SODIUM CHLORIDE (POUR BTL) OPTIME
TOPICAL | Status: DC | PRN
  Administered 2022-05-26: 1000 mL

## 2022-05-26 MED ORDER — ONDANSETRON HCL 4 MG PO TABS: 4.0000 mg | ORAL_TABLET | Freq: Four times a day (QID) | ORAL | Status: DC | PRN

## 2022-05-26 MED ORDER — POVIDONE-IODINE 10 % EX SWAB: 2.0000 | Freq: Once | CUTANEOUS | Status: DC

## 2022-05-26 MED ORDER — SODIUM CHLORIDE (PF) 0.9 % IJ SOLN
INTRAMUSCULAR | Status: AC
  Filled 2022-05-26: qty 10

## 2022-05-26 MED ORDER — HYDROMORPHONE HCL 2 MG PO TABS: 2.0000 mg | ORAL_TABLET | Freq: Four times a day (QID) | ORAL | 0 refills | Status: DC | PRN

## 2022-05-26 MED ORDER — PHENYLEPHRINE HCL-NACL 20-0.9 MG/250ML-% IV SOLN
INTRAVENOUS | Status: DC | PRN
  Administered 2022-05-26: 25 ug/min via INTRAVENOUS

## 2022-05-26 MED ORDER — TRANEXAMIC ACID-NACL 1000-0.7 MG/100ML-% IV SOLN
INTRAVENOUS | Status: AC
  Filled 2022-05-26: qty 100

## 2022-05-26 MED ORDER — METHOCARBAMOL 500 MG IVPB - SIMPLE MED: 500.0000 mg | Freq: Four times a day (QID) | INTRAVENOUS | Status: DC | PRN

## 2022-05-26 MED ORDER — METHOCARBAMOL 750 MG PO TABS: 750.0000 mg | ORAL_TABLET | Freq: Three times a day (TID) | ORAL | 0 refills | Status: DC | PRN

## 2022-05-26 MED ORDER — TRAMADOL HCL 50 MG PO TABS
ORAL_TABLET | ORAL | Status: AC
  Filled 2022-05-26: qty 1

## 2022-05-26 MED ORDER — SODIUM CHLORIDE FLUSH 0.9 % IV SOLN
INTRAVENOUS | Status: DC | PRN
  Administered 2022-05-26: 10 mL via INTRAVENOUS

## 2022-05-26 MED ORDER — ONDANSETRON HCL 4 MG/2ML IJ SOLN
INTRAMUSCULAR | Status: AC
  Filled 2022-05-26: qty 2

## 2022-05-26 MED ORDER — ACETAMINOPHEN 500 MG PO TABS
1000.0000 mg | ORAL_TABLET | Freq: Once | ORAL | Status: AC
  Administered 2022-05-26: 1000 mg via ORAL
  Filled 2022-05-26: qty 2

## 2022-05-26 MED ORDER — DEXAMETHASONE SODIUM PHOSPHATE 10 MG/ML IJ SOLN
8.0000 mg | Freq: Once | INTRAMUSCULAR | Status: AC
  Administered 2022-05-26: 8 mg via INTRAVENOUS

## 2022-05-26 MED ORDER — OXYCODONE HCL 5 MG PO TABS: 5.0000 mg | ORAL_TABLET | ORAL | Status: DC | PRN

## 2022-05-26 MED ORDER — BUPIVACAINE LIPOSOME 1.3 % IJ SUSP: 10.0000 mL | Freq: Once | INTRAMUSCULAR | Status: DC

## 2022-05-26 MED ORDER — DOCUSATE SODIUM 100 MG PO CAPS: 100.0000 mg | ORAL_CAPSULE | Freq: Two times a day (BID) | ORAL | 0 refills | Status: DC | PRN

## 2022-05-26 MED ORDER — DEXAMETHASONE SODIUM PHOSPHATE 10 MG/ML IJ SOLN
INTRAMUSCULAR | Status: AC
  Filled 2022-05-26: qty 1

## 2022-05-26 MED ORDER — HYDROMORPHONE HCL 1 MG/ML IJ SOLN: 0.5000 mg | INTRAMUSCULAR | Status: DC | PRN

## 2022-05-26 SURGICAL SUPPLY — 47 items
APL PRP STRL LF DISP 70% ISPRP (MISCELLANEOUS) ×1
BAG COUNTER SPONGE SURGICOUNT (BAG) IMPLANT
BAG SPEC THK2 15X12 ZIP CLS (MISCELLANEOUS)
BAG SPNG CNTER NS LX DISP (BAG)
BAG ZIPLOCK 12X15 (MISCELLANEOUS) IMPLANT
BLADE SAG 18X100X1.27 (BLADE) ×1 IMPLANT
BLADE SURG SZ10 CARB STEEL (BLADE) ×1 IMPLANT
CHLORAPREP W/TINT 26 (MISCELLANEOUS) ×1 IMPLANT
CLSR STERI-STRIP ANTIMIC 1/2X4 (GAUZE/BANDAGES/DRESSINGS) ×1 IMPLANT
COVER PERINEAL POST (MISCELLANEOUS) ×1 IMPLANT
COVER SURGICAL LIGHT HANDLE (MISCELLANEOUS) ×1 IMPLANT
DRAPE IMP U-DRAPE 54X76 (DRAPES) ×1 IMPLANT
DRAPE STERI IOBAN 125X83 (DRAPES) ×1 IMPLANT
DRAPE U-SHAPE 47X51 STRL (DRAPES) ×2 IMPLANT
DRSG MEPILEX POST OP 4X8 (GAUZE/BANDAGES/DRESSINGS) ×1 IMPLANT
ELECT REM PT RETURN 15FT ADLT (MISCELLANEOUS) ×1 IMPLANT
GLOVE BIO SURGEON STRL SZ7.5 (GLOVE) ×1 IMPLANT
GLOVE BIOGEL PI IND STRL 7.5 (GLOVE) ×1 IMPLANT
GLOVE BIOGEL PI IND STRL 8 (GLOVE) ×1 IMPLANT
GLOVE SURG SYN 7.5  E (GLOVE) ×1
GLOVE SURG SYN 7.5 E (GLOVE) ×1 IMPLANT
GLOVE SURG SYN 7.5 PF PI (GLOVE) ×1 IMPLANT
GOWN STRL REUS W/ TWL LRG LVL3 (GOWN DISPOSABLE) ×1 IMPLANT
GOWN STRL REUS W/ TWL XL LVL3 (GOWN DISPOSABLE) ×1 IMPLANT
GOWN STRL REUS W/TWL LRG LVL3 (GOWN DISPOSABLE) ×1
GOWN STRL REUS W/TWL XL LVL3 (GOWN DISPOSABLE) ×1
HEAD BIOLOX HIP 36/-5 (Joint) IMPLANT
HIP BIOLOX HD 36/-5 (Joint) ×1 IMPLANT
HOLDER FOLEY CATH W/STRAP (MISCELLANEOUS) IMPLANT
INSERT 0 DEGREE 36 (Miscellaneous) IMPLANT
KIT TURNOVER KIT A (KITS) IMPLANT
MANIFOLD NEPTUNE II (INSTRUMENTS) ×1 IMPLANT
NS IRRIG 1000ML POUR BTL (IV SOLUTION) ×1 IMPLANT
PACK ANTERIOR HIP CUSTOM (KITS) ×1 IMPLANT
PROTECTOR NERVE ULNAR (MISCELLANEOUS) ×1 IMPLANT
SCREW HEX LP 6.5X20 (Screw) IMPLANT
SHELL ACETAB TRIDENT 48 (Shell) IMPLANT
SPIKE FLUID TRANSFER (MISCELLANEOUS) ×2 IMPLANT
STEM HIP 4 127DEG (Stem) IMPLANT
SUT MNCRL AB 3-0 PS2 18 (SUTURE) ×1 IMPLANT
SUT VIC AB 0 CT1 36 (SUTURE) ×1 IMPLANT
SUT VIC AB 1 CT1 36 (SUTURE) ×1 IMPLANT
SUT VIC AB 2-0 CT1 27 (SUTURE) ×2
SUT VIC AB 2-0 CT1 TAPERPNT 27 (SUTURE) ×2 IMPLANT
TRAY FOLEY MTR SLVR 16FR STAT (SET/KITS/TRAYS/PACK) IMPLANT
TUBE SUCTION HIGH CAP CLEAR NV (SUCTIONS) ×1 IMPLANT
WATER STERILE IRR 1000ML POUR (IV SOLUTION) ×2 IMPLANT

## 2022-05-26 NOTE — Anesthesia Procedure Notes (Signed)
Spinal  Patient location during procedure: OR Start time: 05/26/2022 8:31 AM End time: 05/26/2022 8:34 AM Reason for block: surgical anesthesia Staffing Performed: resident/CRNA  Resident/CRNA: Nelle Don, CRNA Performed by: Nelle Don, CRNA Authorized by: Trevor Iha, MD   Preanesthetic Checklist Completed: patient identified, IV checked, risks and benefits discussed, surgical consent, monitors and equipment checked, pre-op evaluation and timeout performed Spinal Block Patient position: sitting Prep: DuraPrep Patient monitoring: heart rate, continuous pulse ox and blood pressure Approach: midline Location: L3-4 Injection technique: single-shot Needle Needle type: Pencan  Needle gauge: 24 G Needle length: 9 cm Additional Notes Expiration date of kit checked. Clear CSF flow prior to injection. Dr. Richardson Landry present

## 2022-05-26 NOTE — Anesthesia Postprocedure Evaluation (Signed)
Anesthesia Post Note  Patient: CARRELL KNEIB  Procedure(s) Performed: TOTAL HIP ARTHROPLASTY ANTERIOR APPROACH (Right: Hip)     Patient location during evaluation: Nursing Unit Anesthesia Type: Spinal Level of consciousness: oriented and awake and alert Pain management: pain level controlled Vital Signs Assessment: post-procedure vital signs reviewed and stable Respiratory status: spontaneous breathing and respiratory function stable Cardiovascular status: blood pressure returned to baseline and stable Postop Assessment: no headache, no backache, no apparent nausea or vomiting and patient able to bend at knees Anesthetic complications: no  No notable events documented.  Last Vitals:  Vitals:   05/26/22 1230 05/26/22 1315  BP: 133/68 115/67  Pulse: 74 78  Resp:    Temp: 36.4 C   SpO2: 100% 100%    Last Pain:  Vitals:   05/26/22 1300  TempSrc:   PainSc: 0-No pain                 Trevor Iha

## 2022-05-26 NOTE — Transfer of Care (Signed)
Immediate Anesthesia Transfer of Care Note  Patient: Carla Lamb  Procedure(s) Performed: TOTAL HIP ARTHROPLASTY ANTERIOR APPROACH (Right: Hip)  Patient Location: PACU  Anesthesia Type:Spinal  Level of Consciousness: drowsy  Airway & Oxygen Therapy: Patient Spontanous Breathing and Patient connected to face mask oxygen  Post-op Assessment: Report given to RN and Post -op Vital signs reviewed and stable  Post vital signs: Reviewed and stable  Last Vitals:  Vitals Value Taken Time  BP 94/51   Temp    Pulse 60 05/26/22 1022  Resp 12   SpO2 100 % 05/26/22 1022  Vitals shown include unvalidated device data.  Last Pain:  Vitals:   05/26/22 0641  TempSrc: Oral  PainSc: 6       Patients Stated Pain Goal: 4 (05/26/22 0641)  Complications: No notable events documented.

## 2022-05-26 NOTE — Discharge Instructions (Signed)

## 2022-05-26 NOTE — Interval H&P Note (Signed)
History and Physical Interval Note:  05/26/2022 7:27 AM  Carla Lamb  has presented today for surgery, with the diagnosis of OA RIGHT HIP.  The various methods of treatment have been discussed with the patient and family. After consideration of risks, benefits and other options for treatment, the patient has consented to  Procedure(s): TOTAL HIP ARTHROPLASTY ANTERIOR APPROACH (Right) as a surgical intervention.  The patient's history has been reviewed, patient examined, no change in status, stable for surgery.  I have reviewed the patient's chart and labs.  Questions were answered to the patient's satisfaction.     Sheral Apley

## 2022-05-26 NOTE — Anesthesia Procedure Notes (Signed)
Procedure Name: MAC Date/Time: 05/26/2022 8:31 AM  Performed by: Nelle Don, CRNAPre-anesthesia Checklist: Patient identified, Emergency Drugs available, Suction available and Patient being monitored Oxygen Delivery Method: Simple face mask

## 2022-05-26 NOTE — Evaluation (Signed)
Physical Therapy Evaluation Patient Details Name: Carla Lamb MRN: [id number] DOB: [birth year] Today's Date: 05/26/2022  History of Present Illness  67 yo female presents to therapy s/p R THA, anterior approach on 05/26/2022 due to failure of conservative measures. Pt has PMH including but not limited to: ca s/p R mastectomy, padgets dz, RA, ACDF (2013), L UE sx due to fx and pelvic fx attributed to fall (2023), and L THA (2022).  Clinical Impression      Carla Lamb is a 67 y.o. female POD 0 s/p R THA, AA. Patient reports IND with mobility at baseline. Patient is now limited by functional impairments (see PT problem list below) and requires min guard and cues for transfers and gait with RW. Patient was able to ambulate 45 and 20 feet with RW and min guard and progressing to S and cues for safe walker management. Patient educated on safe sequencing for stair mobility, car transfers, pain management, use of CP and fall risk prevention pt and spouse verbalized understanding of safe guarding position for people assisting with mobility. Patient instructed in exercises to facilitate ROM and circulation reviewed with HO provided. Patient will benefit from continued skilled PT interventions to address impairments and progress towards PLOF. Patient has met mobility goals at adequate level for discharge home with family support and pt to start OPPT 5/9; will continue to follow if pt continues acute stay to progress towards Mod I goals.    Recommendations for follow up therapy are one component of a multi-disciplinary discharge planning process, led by the attending physician.  Recommendations may be updated based on patient status, additional functional criteria and insurance authorization.  Follow Up Recommendations       Assistance Recommended at Discharge Intermittent Supervision/Assistance  Patient can return home with the following  A little help with walking and/or transfers;A little help with  bathing/dressing/bathroom;Assistance with cooking/housework;Assist for transportation;Help with stairs or ramp for entrance    Equipment Recommendations Rolling walker (2 wheels)  Recommendations for Other Services       Functional Status Assessment Patient has had a recent decline in their functional status and demonstrates the ability to make significant improvements in function in a reasonable and predictable amount of time.     Precautions / Restrictions Precautions Precautions: Fall Restrictions Weight Bearing Restrictions: No      Mobility  Bed Mobility Overal bed mobility: Needs Assistance Bed Mobility: Supine to Sit     Supine to sit: Supervision     General bed mobility comments: min cues    Transfers Overall transfer level: Needs assistance Equipment used: Rolling walker (2 wheels) Transfers: Sit to/from Stand Sit to Stand: Min guard           General transfer comment: cues for proper UE and R LE placement (bed, commode and recliner transfers, L UE limited due to fall fx and sx)    Ambulation/Gait Ambulation/Gait assistance: Supervision Gait Distance (Feet): 45 Feet Assistive device: Rolling walker (2 wheels) Gait Pattern/deviations: Step-to pattern, Decreased stance time - right Gait velocity: decreased     General Gait Details: step almost through pattern with narrow BOS and limited L UE support on RW  Stairs Stairs: Yes Stairs assistance: Min guard Stair Management: One rail Right (HHA with L UE) Number of Stairs: 2 General stair comments: pt and spouse ed provided on safe navigation of steps to enter home with R UE support at handrail and L HHA  Wheelchair Mobility    Modified Rankin (Stroke  Patients Only)       Balance Overall balance assessment: Needs assistance Sitting-balance support: Feet unsupported, Single extremity supported Sitting balance-Leahy Scale: Good     Standing balance support: Bilateral upper extremity supported,  Reliant on assistive device for balance, During functional activity (static standing no UE support) Standing balance-Leahy Scale: Fair                               Pertinent Vitals/Pain Pain Assessment Pain Assessment: 0-10 Pain Score: 2  Pain Location: R hip Pain Descriptors / Indicators: Constant, Operative site guarding Pain Intervention(s): Limited activity within patient's tolerance, Monitored during session, Premedicated before session, Repositioned, Ice applied    Home Living Family/patient expects to be discharged to:: Private residence Living Arrangements: Spouse/significant other Available Help at Discharge: Family Type of Home: House Home Access: Stairs to enter Entrance Stairs-Rails: Doctor, general practice of Steps: 4   Home Layout: Multi-level;Laundry or work area in basement;Able to live on main level with bedroom/bathroom Home Equipment: BSC/3in1;Cane - single point      Prior Function Prior Level of Function : Independent/Modified Independent;Working/employed;Driving             Mobility Comments: IND with all ADLs, self care tasks and IADLs, workin g       Hand Dominance        Extremity/Trunk Assessment        Lower Extremity Assessment Lower Extremity Assessment: RLE deficits/detail RLE Deficits / Details: ankle DF/PF 5/5 RLE Sensation: decreased light touch (pt reports tingling B LEs)    Cervical / Trunk Assessment Cervical / Trunk Assessment: Normal  Communication   Communication: No difficulties  Cognition Arousal/Alertness: Awake/alert Behavior During Therapy: WFL for tasks assessed/performed Overall Cognitive Status: Within Functional Limits for tasks assessed                                          General Comments      Exercises Total Joint Exercises Ankle Circles/Pumps: AROM, Both, 20 reps Quad Sets: AROM, Right, 5 reps Heel Slides: AROM, Right, 5 reps Hip ABduction/ADduction:  AROM, Right, 5 reps, Supine, Standing Long Arc Quad: AROM, 5 reps, Right Knee Flexion: AROM, Right, 5 reps, Standing Marching in Standing: AROM, Right, 5 reps Standing Hip Extension: AROM, Right, 5 reps   Assessment/Plan    PT Assessment Patient needs continued PT services  PT Problem List Decreased strength;Decreased range of motion;Decreased activity tolerance;Decreased balance;Decreased mobility;Decreased coordination;Pain       PT Treatment Interventions DME instruction;Gait training;Stair training;Therapeutic activities;Functional mobility training;Therapeutic exercise;Balance training;Neuromuscular re-education;Patient/family education;Modalities    PT Goals (Current goals can be found in the Care Plan section)  Acute Rehab PT Goals Patient Stated Goal: work in the yard, shop without pain, go and do PT Goal Formulation: With patient Time For Goal Achievement: 06/09/22 Potential to Achieve Goals: Good    Frequency       Co-evaluation               AM-PAC PT "6 Clicks" Mobility  Outcome Measure Help needed turning from your back to your side while in a flat bed without using bedrails?: None Help needed moving from lying on your back to sitting on the side of a flat bed without using bedrails?: A Little Help needed moving to and from a bed to a chair (including  a wheelchair)?: A Little Help needed standing up from a chair using your arms (e.g., wheelchair or bedside chair)?: A Little Help needed to walk in hospital room?: A Little Help needed climbing 3-5 steps with a railing? : A Little 6 Click Score: 19    End of Session Equipment Utilized During Treatment: Gait belt       PT Visit Diagnosis: Unsteadiness on feet (R26.81);Other abnormalities of gait and mobility (R26.89);Muscle weakness (generalized) (M62.81);Pain Pain - Right/Left: Right Pain - part of body: Hip    Time: 1610-9604 PT Time Calculation (min) (ACUTE ONLY): 42 min   Charges:   PT  Evaluation $PT Eval Low Complexity: 1 Low PT Treatments $Gait Training: 8-22 mins $Therapeutic Exercise: 8-22 mins        Rica Mote, PT   Jacqualyn Posey 05/26/2022, 3:05 PM

## 2022-05-26 NOTE — Op Note (Signed)
05/26/2022  9:52 AM  PATIENT:  Carla Lamb   MRN: [id number]  PRE-OPERATIVE DIAGNOSIS:  OA RIGHT HIP  POST-OPERATIVE DIAGNOSIS:  OA RIGHT HIP  PROCEDURE:  Procedure(s): TOTAL HIP ARTHROPLASTY ANTERIOR APPROACH  PREOPERATIVE INDICATIONS:    Carla Lamb is an 67 y.o. female who has a diagnosis of <principal problem not specified> and elected for surgical management after failing conservative treatment.  The risks benefits and alternatives were discussed with the patient including but not limited to the risks of nonoperative treatment, versus surgical intervention including infection, bleeding, nerve injury, periprosthetic fracture, the need for revision surgery, dislocation, leg length discrepancy, blood clots, cardiopulmonary complications, morbidity, mortality, among others, and they were willing to proceed.     OPERATIVE REPORT     SURGEON:   Sheral Apley, MD    ASSISTANT:  Levester Fresh, PA-C, she was present and scrubbed throughout the case, critical for completion in a timely fashion, and for retraction, instrumentation, and closure.     ANESTHESIA:  General    COMPLICATIONS:  None.     COMPONENTS:  [employer] acolade fit femur size 4 with a 36 mm -5 head ball and an acetabular shell size 48 with a  polyethylene liner    PROCEDURE IN DETAIL:   The patient was met in the holding area and  identified.  The appropriate hip was identified and marked at the operative site.  The patient was then transported to the OR  and  placed under anesthesia per that record.  At that point, the patient was  placed in the supine position and  secured to the operating room table and all bony prominences padded. He received pre-operative antibiotics    The operative lower extremity was prepped from the iliac crest to the distal leg.  Sterile draping was performed.  Time out was performed prior to incision.      Skin incision was made just 2 cm lateral to the ASIS  extending in line with the  tensor fascia lata. Electrocautery was used to control all bleeders. I dissected down sharply to the fascia of the tensor fascia lata was confirmed that the muscle fibers beneath were running posteriorly. I then incised the fascia over the superficial tensor fascia lata in line with the incision. The fascia was elevated off the anterior aspect of the muscle the muscle was retracted posteriorly and protected throughout the case. I then used electrocautery to incise the tensor fascia lata fascia control and all bleeders. Immediately visible was the fat over top of the anterior neck and capsule.  I removed the anterior fat from the capsule and elevated the rectus muscle off of the anterior capsule. I then removed a large time of capsule. The retractors were then placed over the anterior acetabulum as well as around the superior and inferior neck.  I then made a femoral neck cut. Then used the power corkscrew to remove the femoral head from the acetabulum and thoroughly irrigated the acetabulum. I sized the femoral head.    I then exposed the deep acetabulum, cleared out any tissue including the ligamentum teres.   After adequate visualization, I excised the labrum, and then sequentially reamed.  I then impacted the acetabular implant into place using fluoroscopy for guidance.  Appropriate version and inclination was confirmed clinically matching their bony anatomy, and with fluoroscopy.  I placed a 20 mm screw in the posterior/superio position with an excellent bite.    I then placed the polyethylene liner in  place  I then adducted the leg and released the external rotators from the posterior femur allowing it to be easily delivered up lateral and anterior to the acetabulum for preparation of the femoral canal.    I then prepared the proximal femur using the cookie-cutter and then sequentially reamed and broached.  A trial broach, neck, and head was utilized, and I reduced the hip and used floroscopy to  assess the neck length and femoral implant.  I then impacted the femoral prosthesis into place into the appropriate version. The hip was then reduced and fluoroscopy confirmed appropriate position. Leg lengths were restored.  I then irrigated the hip copiously again with, and repaired the fascia with Vicryl, followed by monocryl for the subcutaneous tissue, Monocryl for the skin, Steri-Strips and sterile gauze. The patient was then awakened and returned to PACU in stable and satisfactory condition. There were no complications.  POST OPERATIVE PLAN: WBAT, DVT px: SCD's/TED, ambulation and chemical dvt px  Genevieve Ritzel, MD Orthopedic Surgeon [phone]     

## 2022-06-04 ENCOUNTER — Encounter (HOSPITAL_COMMUNITY): Payer: Self-pay | Admitting: Orthopedic Surgery

## 2022-07-24 ENCOUNTER — Other Ambulatory Visit: Payer: Self-pay

## 2022-07-24 DIAGNOSIS — R7301 Impaired fasting glucose: Secondary | ICD-10-CM

## 2022-07-24 DIAGNOSIS — Z130 Encounter for screening for diseases of the blood and blood-forming organs and certain disorders involving the immune mechanism: Secondary | ICD-10-CM

## 2022-07-24 DIAGNOSIS — Z0000 Encounter for general adult medical examination without abnormal findings: Secondary | ICD-10-CM

## 2022-08-04 ENCOUNTER — Other Ambulatory Visit: Payer: Medicare Other

## 2022-08-04 DIAGNOSIS — Z130 Encounter for screening for diseases of the blood and blood-forming organs and certain disorders involving the immune mechanism: Secondary | ICD-10-CM

## 2022-08-04 DIAGNOSIS — R7301 Impaired fasting glucose: Secondary | ICD-10-CM

## 2022-08-04 DIAGNOSIS — Z0000 Encounter for general adult medical examination without abnormal findings: Secondary | ICD-10-CM

## 2022-08-04 DIAGNOSIS — E785 Hyperlipidemia, unspecified: Secondary | ICD-10-CM

## 2022-08-04 NOTE — Addendum Note (Signed)
Addended by: Tonny Bollman on: 08/04/2022 08:20 AM   Modules accepted: Orders

## 2022-08-05 LAB — CBC WITH DIFFERENTIAL/PLATELET
Basophils Absolute: 0.1 10*3/uL (ref 0.0–0.2)
Basos: 1 %
EOS (ABSOLUTE): 0.2 10*3/uL (ref 0.0–0.4)
Eos: 4 %
Hematocrit: 43.3 % (ref 34.0–46.6)
Hemoglobin: 14.4 g/dL (ref 11.1–15.9)
Immature Grans (Abs): 0 10*3/uL (ref 0.0–0.1)
Immature Granulocytes: 0 %
Lymphocytes Absolute: 2.3 10*3/uL (ref 0.7–3.1)
Lymphs: 45 %
MCH: 31.8 pg (ref 26.6–33.0)
MCHC: 33.3 g/dL (ref 31.5–35.7)
MCV: 96 fL (ref 79–97)
Monocytes Absolute: 0.5 10*3/uL (ref 0.1–0.9)
Monocytes: 11 %
Neutrophils Absolute: 1.9 10*3/uL (ref 1.4–7.0)
Neutrophils: 39 %
Platelets: 351 10*3/uL (ref 150–450)
RBC: 4.53 x10E6/uL (ref 3.77–5.28)
RDW: 11.9 % (ref 11.7–15.4)
WBC: 5 10*3/uL (ref 3.4–10.8)

## 2022-08-05 LAB — COMPREHENSIVE METABOLIC PANEL
ALT: 32 IU/L (ref 0–32)
AST: 28 IU/L (ref 0–40)
Albumin: 4.2 g/dL (ref 3.9–4.9)
Alkaline Phosphatase: 113 IU/L (ref 44–121)
BUN/Creatinine Ratio: 17 (ref 12–28)
BUN: 12 mg/dL (ref 8–27)
Bilirubin Total: <0.2 mg/dL (ref 0.0–1.2)
CO2: 22 mmol/L (ref 20–29)
Calcium: 10 mg/dL (ref 8.7–10.3)
Chloride: 102 mmol/L (ref 96–106)
Creatinine, Ser: 0.69 mg/dL (ref 0.57–1.00)
Globulin, Total: 2.5 g/dL (ref 1.5–4.5)
Glucose: 91 mg/dL (ref 70–99)
Potassium: 4.5 mmol/L (ref 3.5–5.2)
Sodium: 140 mmol/L (ref 134–144)
Total Protein: 6.7 g/dL (ref 6.0–8.5)
eGFR: 94 mL/min/{1.73_m2} (ref >59)

## 2022-08-05 LAB — TSH: TSH: 1.79 u[IU]/mL (ref 0.450–4.500)

## 2022-08-05 LAB — LIPID PANEL
Chol/HDL Ratio: 2.3 ratio (ref 0.0–4.4)
Cholesterol, Total: 241 mg/dL — ABNORMAL HIGH (ref 100–199)
HDL: 103 mg/dL (ref >39)
LDL Chol Calc (NIH): 126 mg/dL — ABNORMAL HIGH (ref 0–99)
Triglycerides: 69 mg/dL (ref 0–149)
VLDL Cholesterol Cal: 12 mg/dL (ref 5–40)

## 2022-08-05 LAB — HEMOGLOBIN A1C
Est. average glucose Bld gHb Est-mCnc: 105 mg/dL
Hgb A1c MFr Bld: 5.3 % (ref 4.8–5.6)

## 2022-08-12 ENCOUNTER — Encounter: Payer: Medicare Other | Admitting: Family Medicine

## 2022-08-13 ENCOUNTER — Ambulatory Visit (INDEPENDENT_AMBULATORY_CARE_PROVIDER_SITE_OTHER): Payer: Medicare Other

## 2022-08-13 VITALS — Ht 64.5 in | Wt 123.0 lb

## 2022-08-13 DIAGNOSIS — Z0000 Encounter for general adult medical examination without abnormal findings: Secondary | ICD-10-CM

## 2022-08-13 DIAGNOSIS — Z1159 Encounter for screening for other viral diseases: Secondary | ICD-10-CM

## 2022-08-13 NOTE — Progress Notes (Signed)
Subjective:   Carla Lamb is a 67 y.o. female who presents for Medicare Annual (Subsequent) preventive examination.  Visit Complete: Virtual  I connected with  Donalda Ewings on 08/13/22 by a audio enabled telemedicine application and verified that I am speaking with the correct person using two identifiers.  Patient Location: Home  Provider Location: Home Office  I discussed the limitations of evaluation and management by telemedicine. The patient expressed understanding and agreed to proceed.  Patient Medicare AWV questionnaire was completed by the patient on 08/06/22; I have confirmed that all information answered by patient is correct and no changes since this date.  Review of Systems     Cardiac Risk Factors include: advanced age (>63men, >63 women)     Objective:    Today's Vitals   08/13/22 1304  Weight: 123 lb (55.8 kg)  Height: 5' 4.5" (1.638 m)   Body mass index is 20.79 kg/m.     08/13/2022    1:19 PM 05/15/2022    1:15 PM 12/19/2020    2:02 PM 08/25/2011    2:15 PM 08/24/2011    8:31 AM  Advanced Directives  Does Patient Have a Medical Advance Directive? No No Yes Patient has advance directive, copy not in chart Patient has advance directive, copy not in chart  Type of Advance Directive   Healthcare Power of [place];Living will Healthcare Power of [place];Living will Healthcare Power of [place];Living will  Copy of Healthcare Power of Attorney in Chart?   No - copy requested Copy requested from family Copy requested from family  Would patient like information on creating a medical advance directive? No - Patient declined Yes (MAU/Ambulatory/Procedural Areas - Information given)     Pre-existing out of facility DNR order (yellow form or pink MOST form)     No    Current Medications (verified) Outpatient Encounter Medications as of 08/13/2022  Medication Sig   acetaminophen (TYLENOL) 500 MG tablet Take 2 tablets (1,000 mg total) by mouth every 6 (six) hours  as needed for mild pain or moderate pain.   cholecalciferol (VITAMIN D3) 25 MCG (1000 UNIT) tablet Take 1,000 Units by mouth every evening.   denosumab (PROLIA) 60 MG/ML SOSY injection Inject 60 mg into the skin every 6 (six) months.   DULoxetine (CYMBALTA) 20 MG capsule Take 1 capsule (20 mg total) by mouth daily. (Patient taking differently: Take 20 mg by mouth every evening.)   fluticasone (FLONASE) 50 MCG/ACT nasal spray SPRAY 2 SPRAYS INTO EACH NOSTRIL EVERY DAY (Patient taking differently: Place 1 spray into both nostrils every evening.)   folic acid (FOLVITE) 1 MG tablet Take 3 mg by mouth every evening.   Golimumab (SIMPONI ARIA IV) Inject 1 Dose into the vein every 8 (eight) weeks.   hydroxychloroquine (PLAQUENIL) 200 MG tablet Take 400 mg by mouth every evening.   methotrexate (RHEUMATREX) 2.5 MG tablet Take 15 mg by mouth every Monday.  Caution:Chemotherapy. Protect from light.   [DISCONTINUED] aspirin EC 81 MG tablet Take 1 tablet (81 mg total) by mouth 2 (two) times daily. To prevent blood clots for 30 days after surgery.   [DISCONTINUED] celecoxib (CELEBREX) 200 MG capsule Take 1 capsule (200 mg total) by mouth 2 (two) times daily as needed (for pain and inflammation).   [DISCONTINUED] docusate sodium (COLACE) 100 MG capsule Take 1 capsule (100 mg total) by mouth 2 (two) times daily as needed for mild constipation or moderate constipation.   [DISCONTINUED] fexofenadine (ALLEGRA) 180 MG tablet TAKE  1 TABLET BY MOUTH EVERY DAY (Patient taking differently: Take 180 mg by mouth every evening.)   [DISCONTINUED] HYDROmorphone (DILAUDID) 2 MG tablet Take 1-2 tablets (2-4 mg total) by mouth every 6 (six) hours as needed for severe pain.   [DISCONTINUED] LIDOCAINE EX Apply 1 spray topically 3 (three) times daily as needed (pain.).   [DISCONTINUED] Lidocaine HCl (LIDAFLEX) 4 % PTCH Place 1 patch onto the skin 2 (two) times daily as needed (pain.).   [DISCONTINUED] methocarbamol (ROBAXIN-750) 750  MG tablet Take 1 tablet (750 mg total) by mouth every 8 (eight) hours as needed for muscle spasms.   No facility-administered encounter medications on file as of 08/13/2022.    Allergies (verified) Patient has no known allergies.   History: Past Medical History:  Diagnosis Date   Anxiety    Bursitis    Cancer (HCC)    Right Mastectomy Padgets Disease   Depression    During treament   Family history of adverse reaction to anesthesia    Mother and sister hard to wake up   Fractured elbow    & forearm   Fractured pelvis (HCC)    HSV-1 infection    Osteoporosis 01/2017   2017 T score -2.5 2019 T score -2.3.   RA (rheumatoid arthritis) [facility])    Past Surgical History:  Procedure Laterality Date   ANTERIOR CERVICAL DECOMP/DISCECTOMY FUSION  08/25/2011   Procedure: ANTERIOR CERVICAL DECOMPRESSION/DISCECTOMY FUSION 3 LEVELS;  Surgeon: Karn Cassis, MD;  Location: [facility];  Service: Neurosurgery;  Laterality: N/A;  Anterior Cervical Decompression/Discectomy Fusion Cervical Four-Five Cervical Six Corpectomy, Fusion to cervical Seven.   Arm surgery Left 09/08/2021   bunions     bilat feet   CERVICAL FUSION  08/25/2011   C 4  5 & 6   HIP SURGERY  05/26/2022   MASTECTOMY  01/20/2004   Right   TOTAL HIP ARTHROPLASTY Left 12/31/2020   Procedure: TOTAL HIP ARTHROPLASTY ANTERIOR APPROACH;  Surgeon: Sheral Apley, MD;  Location: [facility];  Service: Orthopedics;  Laterality: Left;   TOTAL HIP ARTHROPLASTY Right 05/26/2022   Procedure: TOTAL HIP ARTHROPLASTY ANTERIOR APPROACH;  Surgeon: Sheral Apley, MD;  Location: [facility];  Service: Orthopedics;  Laterality: Right;   Vaginal Cyst Removed  01/20/2008   Family History  Problem Relation Age of Onset   Cancer Father        LUNG- SMOKER   Cancer Brother 52       RENAL   Breast cancer Mother 58   Hypertension Mother    Cancer Maternal Grandfather        Colon   Diverticulitis Sister    Social History   Socioeconomic  History   Marital status: Married    Spouse name: Maisie Fus   Number of children: 1   Years of education: 12th   Highest education level: Not on file  Occupational History   Not on file  Tobacco Use   Smoking status: Every Day    Current packs/day: 0.25    Average packs/day: 0.3 packs/day for 10.0 years (2.5 ttl pk-yrs)    Types: Cigarettes    Passive exposure: Never   Smokeless tobacco: Never  Vaping Use   Vaping status: Never Used  Substance and Sexual Activity   Alcohol use: Not Currently   Drug use: Never   Sexual activity: Not Currently    Partners: Male    Birth control/protection: Post-menopausal, Abstinence    Comment: 1st intercourse 67 yo-Fewer than 5  partners  Other Topics Concern   Not on file  Social History Narrative   Not on file   Social Determinants of Health   Financial Resource Strain: Low Risk  (08/13/2022)   Overall Financial Resource Strain (CARDIA)    Difficulty of Paying Living Expenses: Not hard at all  Food Insecurity: No Food Insecurity (08/13/2022)   Hunger Vital Sign    Worried About Running Out of Food in the Last Year: Never true    Ran Out of Food in the Last Year: Never true  Transportation Needs: No Transportation Needs (08/13/2022)   PRAPARE - Administrator, Civil Service (Medical): No    Lack of Transportation (Non-Medical): No  Physical Activity: Inactive (08/13/2022)   Exercise Vital Sign    Days of Exercise per Week: 0 days    Minutes of Exercise per Session: 0 min  Stress: No Stress Concern Present (08/13/2022)   [employer] of Occupational Health - Occupational Stress Questionnaire    Feeling of Stress : Not at all  Social Connections: Moderately Isolated (08/13/2022)   Social Connection and Isolation Panel [NHANES]    Frequency of Communication with Friends and Family: More than three times a week    Frequency of Social Gatherings with Friends and Family: More than three times a week    Attends Religious Services:  Never    Database administrator or Organizations: No    Attends Engineer, structural: Never    Marital Status: Married    Tobacco Counseling Ready to quit: No Counseling given: Yes   Clinical Intake:  Pre-visit preparation completed: Yes  Pain : No/denies pain     BMI - recorded: 20.79 Nutritional Status: BMI of 19-24  Normal Nutritional Risks: None Diabetes: No  How often do you need to have someone help you when you read instructions, pamphlets, or other written materials from your doctor or pharmacy?: 1 - Never  Interpreter Needed?: No  Information entered by :: Theresa Mulligan LPN   Activities of Daily Living    08/13/2022    1:14 PM 08/06/2022    6:09 PM  In your present state of health, do you have any difficulty performing the following activities:  Hearing? 0 0  Vision? 0 0  Difficulty concentrating or making decisions? 0 0  Walking or climbing stairs? 0 1  Dressing or bathing? 0 0  Doing errands, shopping? 0 0  Preparing Food and eating ? N N  Using the Toilet? N N  In the past six months, have you accidently leaked urine? Malvin Johns  Comment Wears pads. Not followed by medical attention   Do you have problems with loss of bowel control? N Y  Managing your Medications? N N  Managing your Finances? N N  Housekeeping or managing your Housekeeping? N Y    Patient Care Team: Melida Quitter, PA as PCP - General (Family Medicine)  Indicate any recent Medical Services you may have received from other than [facility] providers in the past year (date may be approximate).     Assessment:   This is a routine wellness examination for Nadina.  Hearing/Vision screen Hearing Screening - Comments:: Denies hearing difficulties   Vision Screening - Comments:: Wears rx glasses - up to date with routine eye exams with Dr Dione Booze   Dietary issues and exercise activities discussed:     Goals Addressed               This Visit's  Progress     Stay healthy  (pt-stated)         Depression Screen    08/13/2022    1:13 PM 04/15/2022   10:40 AM 02/11/2022    8:33 AM 11/11/2021   11:02 AM 10/14/2021   11:53 AM 05/21/2021    9:34 AM 11/21/2020    9:54 AM  PHQ 2/9 Scores  PHQ - 2 Score 0 4 2 2 2 2 2   PHQ- 9 Score  9 4 6 5 6 7     Fall Risk    08/13/2022    1:15 PM 08/06/2022    6:09 PM 04/15/2022   10:37 AM 03/06/2022    1:25 PM 02/11/2022    8:34 AM  Fall Risk   Falls in the past year? 1 1 1 1 1   Number falls in past yr: 0 0 0 0 0  Injury with Fall? 1 1 1 1 1   Comment fx to pelvis, left elbow and forearm. Followed by medical attention      Risk for fall due to : Mental status change;Orthopedic patient  Impaired balance/gait History of fall(s)   Follow up Falls prevention discussed   Falls evaluation completed     MEDICARE RISK AT HOME:  Medicare Risk at Home - 08/13/22 1325     Any stairs in or around the home? Yes    If so, are there any without handrails? No    Home free of loose throw rugs in walkways, pet beds, electrical cords, etc? Yes    Adequate lighting in your home to reduce risk of falls? Yes    Life alert? No    Use of a cane, walker or w/c? No    Grab bars in the bathroom? No    Shower chair or bench in shower? No    Elevated toilet seat or a handicapped toilet? No             TIMED UP AND GO:  Was the test performed?  No    Cognitive Function:        08/13/2022    1:19 PM 05/21/2021    9:35 AM  6CIT Screen  What Year? 0 points 0 points  What month? 0 points 0 points  What time? 0 points 0 points  Count back from 20 0 points 0 points  Months in reverse 0 points 0 points  Repeat phrase 0 points 0 points  Total Score 0 points 0 points    Immunizations Immunization History  Administered Date(s) Administered   Tdap 03/18/2011    TDAP status: Due, Education has been provided regarding the importance of this vaccine. Advised may receive this vaccine at local pharmacy or Health Dept. Aware to provide a  copy of the vaccination record if obtained from local pharmacy or Health Dept. Verbalized acceptance and understanding.  Flu Vaccine status: Due, Education has been provided regarding the importance of this vaccine. Advised may receive this vaccine at local pharmacy or Health Dept. Aware to provide a copy of the vaccination record if obtained from local pharmacy or Health Dept. Verbalized acceptance and understanding.  Pneumococcal vaccine status: Due, Education has been provided regarding the importance of this vaccine. Advised may receive this vaccine at local pharmacy or Health Dept. Aware to provide a copy of the vaccination record if obtained from local pharmacy or Health Dept. Verbalized acceptance and understanding.  Covid-19 vaccine status: Declined, Education has been provided regarding the importance of this vaccine but patient still  declined. Advised may receive this vaccine at local pharmacy or Health Dept.or vaccine clinic. Aware to provide a copy of the vaccination record if obtained from local pharmacy or Health Dept. Verbalized acceptance and understanding.  Qualifies for Shingles Vaccine? Yes   Zostavax completed No   Shingrix Completed?: No.    Education has been provided regarding the importance of this vaccine. Patient has been advised to call insurance company to determine out of pocket expense if they have not yet received this vaccine. Advised may also receive vaccine at local pharmacy or Health Dept. Verbalized acceptance and understanding.  Screening Tests Health Maintenance  Topic Date Due   COVID-19 Vaccine (1) Never done   Pneumonia Vaccine 26+ Years old (1 of 2 - PCV) Never done   Hepatitis C Screening  Never done   Zoster Vaccines- Shingrix (1 of 2) Never done   DTaP/Tdap/Td (2 - Td or Tdap) 03/17/2021   INFLUENZA VACCINE  08/20/2022   Medicare Annual Wellness (AWV)  08/13/2023   MAMMOGRAM  10/17/2023   Colonoscopy  07/16/2031   DEXA SCAN  Completed   HPV  VACCINES  Aged Out    Health Maintenance  Health Maintenance Due  Topic Date Due   COVID-19 Vaccine (1) Never done   Pneumonia Vaccine 8+ Years old (1 of 2 - PCV) Never done   Hepatitis C Screening  Never done   Zoster Vaccines- Shingrix (1 of 2) Never done   DTaP/Tdap/Td (2 - Td or Tdap) 03/17/2021    Colorectal cancer screening: Type of screening: Colonoscopy. Completed 07/15/21. Repeat every 10 years  Mammogram status: Completed 10/16/21. Repeat every year    Lung Cancer Screening: (Low Dose CT Chest recommended if Age 28-80 years, 20 pack-year currently smoking OR have quit w/in 15years.) does qualify.   Lung Cancer Screening Referral: Deferred  Additional Screening:  Hepatitis C Screening: does qualify;  Deferred  Vision Screening: Recommended annual ophthalmology exams for early detection of glaucoma and other disorders of the eye. Is the patient up to date with their annual eye exam?  Yes  Who is the provider or what is the name of the office in which the patient attends annual eye exams? Dr Dione Booze If pt is not established with a provider, would they like to be referred to a provider to establish care? No .   Dental Screening: Recommended annual dental exams for proper oral hygiene    Community Resource Referral / Chronic Care Management:  CRR required this visit?  No   CCM required this visit?  No     Plan:     I have personally reviewed and noted the following in the patient's chart:   Medical and social history Use of alcohol, tobacco or illicit drugs  Current medications and supplements including opioid prescriptions. Patient is currently taking opioids Functional ability and status Nutritional status Physical activity Advanced directives List of other physicians Hospitalizations, surgeries, and ER visits in previous 12 months Vitals Screenings to include cognitive, depression, and falls Referrals and appointments  In addition, I have reviewed  and discussed with patient certain preventive protocols, quality metrics, and best practice recommendations. A written personalized care plan for preventive services as well as general preventive health recommendations were provided to patient.     Tillie Rung, LPN   [id number]   After Visit Summary: (MyChart) Due to this being a telephonic visit, the after visit summary with patients personalized plan was offered to patient via MyChart   Nurse Notes:  Patient due Hep- C Screening

## 2022-08-13 NOTE — Addendum Note (Signed)
Addended by: Saralyn Pilar on: 08/13/2022 03:25 PM   Modules accepted: Orders

## 2022-08-13 NOTE — Patient Instructions (Addendum)
Carla Lamb , Thank you for taking time to come for your Medicare Wellness Visit. I appreciate your ongoing commitment to your health goals. Please review the following plan we discussed and let me know if I can assist you in the future.   Referrals/Orders/Follow-Ups/Clinician Recommendations:   This is a list of the screening recommended for you and due dates:  Health Maintenance  Topic Date Due   COVID-19 Vaccine (1) Never done   Pneumonia Vaccine (1 of 2 - PCV) Never done   Hepatitis C Screening  Never done   Zoster (Shingles) Vaccine (1 of 2) Never done   DTaP/Tdap/Td vaccine (2 - Td or Tdap) 03/17/2021   Flu Shot  08/20/2022   Medicare Annual Wellness Visit  08/13/2023   Mammogram  10/17/2023   Colon Cancer Screening  07/16/2031   DEXA scan (bone density measurement)  Completed   HPV Vaccine  Aged Out   Opioid Pain Medicine Management Opioids are powerful medicines that are used to treat moderate to severe pain. When used for short periods of time, they can help you to: Sleep better. Do better in physical or occupational therapy. Feel better in the first few days after an injury. Recover from surgery. Opioids should be taken with the supervision of a trained health care provider. They should be taken for the shortest period of time possible. This is because opioids can be addictive, and the longer you take opioids, the greater your risk of addiction. This addiction can also be called opioid use disorder. What are the risks? Using opioid pain medicines for longer than 3 days increases your risk of side effects. Side effects include: Constipation. Nausea and vomiting. Breathing difficulties (respiratory depression). Drowsiness. Confusion. Opioid use disorder. Itching. Taking opioid pain medicine for a long period of time can affect your ability to do daily tasks. It also puts you at risk for: Motor vehicle crashes. Depression. Suicide. Heart attack. Overdose, which can be  life-threatening. What is a pain treatment plan? A pain treatment plan is an agreement between you and your health care provider. Pain is unique to each person, and treatments vary depending on your condition. To manage your pain, you and your health care provider need to work together. To help you do this: Discuss the goals of your treatment, including how much pain you might expect to have and how you will manage the pain. Review the risks and benefits of taking opioid medicines. Remember that a good treatment plan uses more than one approach and minimizes the chance of side effects. Be honest about the amount of medicines you take and about any drug or alcohol use. Get pain medicine prescriptions from only one health care provider. Pain can be managed with many types of alternative treatments. Ask your health care provider to refer you to one or more specialists who can help you manage pain through: Physical or occupational therapy. Counseling (cognitive behavioral therapy). Good nutrition. Biofeedback. Massage. Meditation. Non-opioid medicine. Following a gentle exercise program. How to use opioid pain medicine Taking medicine Take your pain medicine exactly as told by your health care provider. Take it only when you need it. If your pain gets less severe, you may take less than your prescribed dose if your health care provider approves. If you are not having pain, do nottake pain medicine unless your health care provider tells you to take it. If your pain is severe, do nottry to treat it yourself by taking more pills than instructed on your prescription.  Contact your health care provider for help. Write down the times when you take your pain medicine. It is easy to become confused while on pain medicine. Writing the time can help you avoid overdose. Take other over-the-counter or prescription medicines only as told by your health care provider. Keeping yourself and others safe  While  you are taking opioid pain medicine: Do not drive, use machinery, or power tools. Do not sign legal documents. Do not drink alcohol. Do not take sleeping pills. Do not supervise children by yourself. Do not do activities that require climbing or being in high places. Do not go to a lake, river, ocean, spa, or swimming pool. Do not share your pain medicine with anyone. Keep pain medicine in a locked cabinet or in a secure area where pets and children cannot reach it. Stopping your use of opioids If you have been taking opioid medicine for more than a few weeks, you may need to slowly decrease (taper) how much you take until you stop completely. Tapering your use of opioids can decrease your risk of symptoms of withdrawal, such as: Pain and cramping in the abdomen. Nausea. Sweating. Sleepiness. Restlessness. Uncontrollable shaking (tremors). Cravings for the medicine. Do not attempt to taper your use of opioids on your own. Talk with your health care provider about how to do this. Your health care provider may prescribe a step-down schedule based on how much medicine you are taking and how long you have been taking it. Getting rid of leftover pills Do not save any leftover pills. Get rid of leftover pills safely by: Taking the medicine to a prescription take-back program. This is usually offered by the county or law enforcement. Bringing them to a pharmacy that has a drug disposal container. Flushing them down the toilet. Check the label or package insert of your medicine to see whether this is safe to do. Throwing them out in the trash. Check the label or package insert of your medicine to see whether this is safe to do. If it is safe to throw it out, remove the medicine from the original container, put it into a sealable bag or container, and mix it with used coffee grounds, food scraps, dirt, or cat litter before putting it in the trash. Follow these instructions at home: Activity Do  exercises as told by your health care provider. Avoid activities that make your pain worse. Return to your normal activities as told by your health care provider. Ask your health care provider what activities are safe for you. General instructions You may need to take these actions to prevent or treat constipation: Drink enough fluid to keep your urine pale yellow. Take over-the-counter or prescription medicines. Eat foods that are high in fiber, such as beans, whole grains, and fresh fruits and vegetables. Limit foods that are high in fat and processed sugars, such as fried or sweet foods. Keep all follow-up visits. This is important. Where to find support If you have been taking opioids for a long time, you may benefit from receiving support for quitting from a local support group or counselor. Ask your health care provider for a referral to these resources in your area. Where to find more information Centers for Disease Control and Prevention (CDC): FootballExhibition.com.br [place] Food and Drug Administration (FDA): PumpkinSearch.com.ee Get help right away if: You may have taken too much of an opioid (overdosed). Common symptoms of an overdose: Your breathing is slower or more shallow than normal. You have a very slow  heartbeat (pulse). You have slurred speech. You have nausea and vomiting. Your pupils become very small. You have other potential symptoms: You are very confused. You faint or feel like you will faint. You have cold, clammy skin. You have blue lips or fingernails. You have thoughts of harming yourself or harming others. These symptoms may represent a serious problem that is an emergency. Do not wait to see if the symptoms will go away. Get medical help right away. Call your local emergency services (911 in the [place] Do not drive yourself to the hospital.  If you ever feel like you may hurt yourself or others, or have thoughts about taking your own life, get help right away. Go to your nearest  emergency department or: Call your local emergency services (911 in the [place] Call the [facility] (501-067-8691 in the [place] Call a suicide crisis helpline, such as the National Suicide Prevention Lifeline at 973 058 8415 or 988 in the [place] This is open 24 hours a day in the [place] Text the Crisis Text Line at (343)632-3256 (in the [place] Summary Opioid medicines can help you manage moderate to severe pain for a short period of time. A pain treatment plan is an agreement between you and your health care provider. Discuss the goals of your treatment, including how much pain you might expect to have and how you will manage the pain. If you think that you or someone else may have taken too much of an opioid, get medical help right away. This information is not intended to replace advice given to you by your health care provider. Make sure you discuss any questions you have with your health care provider. Document Revised: 07/31/2020 Document Reviewed: 04/17/2020 Elsevier Patient Education  2024 Elsevier Inc.  Advanced directives: (Declined) Advance directive discussed with you today. Even though you declined this today, please call our office should you change your mind, and we can give you the proper paperwork for you to fill out.  Next Medicare Annual Wellness Visit scheduled for next year: Yes  Preventive Care 69 Years and Older, Female Preventive care refers to lifestyle choices and visits with your health care provider that can promote health and wellness. What does preventive care include? A yearly physical exam. This is also called an annual well check. Dental exams once or twice a year. Routine eye exams. Ask your health care provider how often you should have your eyes checked. Personal lifestyle choices, including: Daily care of your teeth and gums. Regular physical activity. Eating a healthy diet. Avoiding tobacco and drug use. Limiting alcohol use. Practicing  safe sex. Taking low-dose aspirin every day. Taking vitamin and mineral supplements as recommended by your health care provider. What happens during an annual well check? The services and screenings done by your health care provider during your annual well check will depend on your age, overall health, lifestyle risk factors, and family history of disease. Counseling  Your health care provider may ask you questions about your: Alcohol use. Tobacco use. Drug use. Emotional well-being. Home and relationship well-being. Sexual activity. Eating habits. History of falls. Memory and ability to understand (cognition). Work and work Astronomer. Reproductive health. Screening  You may have the following tests or measurements: Height, weight, and BMI. Blood pressure. Lipid and cholesterol levels. These may be checked every 5 years, or more frequently if you are over 77 years old. Skin check. Lung cancer screening. You may have this screening every year starting at age 13 if you  have a 30-pack-year history of smoking and currently smoke or have quit within the past 15 years. Fecal occult blood test (FOBT) of the stool. You may have this test every year starting at age 14. Flexible sigmoidoscopy or colonoscopy. You may have a sigmoidoscopy every 5 years or a colonoscopy every 10 years starting at age 72. Hepatitis C blood test. Hepatitis B blood test. Sexually transmitted disease (STD) testing. Diabetes screening. This is done by checking your blood sugar (glucose) after you have not eaten for a while (fasting). You may have this done every 1-3 years. Bone density scan. This is done to screen for osteoporosis. You may have this done starting at age 60. Mammogram. This may be done every 1-2 years. Talk to your health care provider about how often you should have regular mammograms. Talk with your health care provider about your test results, treatment options, and if necessary, the need for more  tests. Vaccines  Your health care provider may recommend certain vaccines, such as: Influenza vaccine. This is recommended every year. Tetanus, diphtheria, and acellular pertussis (Tdap, Td) vaccine. You may need a Td booster every 10 years. Zoster vaccine. You may need this after age 4. Pneumococcal 13-valent conjugate (PCV13) vaccine. One dose is recommended after age 34. Pneumococcal polysaccharide (PPSV23) vaccine. One dose is recommended after age 26. Talk to your health care provider about which screenings and vaccines you need and how often you need them. This information is not intended to replace advice given to you by your health care provider. Make sure you discuss any questions you have with your health care provider. Document Released: 02/01/2015 Document Revised: 09/25/2015 Document Reviewed: 11/06/2014 Elsevier Interactive Patient Education  2017 [employer].  Fall Prevention in the Home Falls can cause injuries. They can happen to people of all ages. There are many things you can do to make your home safe and to help prevent falls. What can I do on the outside of my home? Regularly fix the edges of walkways and driveways and fix any cracks. Remove anything that might make you trip as you walk through a door, such as a raised step or threshold. Trim any bushes or trees on the path to your home. Use bright outdoor lighting. Clear any walking paths of anything that might make someone trip, such as rocks or tools. Regularly check to see if handrails are loose or broken. Make sure that both sides of any steps have handrails. Any raised decks and porches should have guardrails on the edges. Have any leaves, snow, or ice cleared regularly. Use sand or salt on walking paths during winter. Clean up any spills in your garage right away. This includes oil or grease spills. What can I do in the bathroom? Use night lights. Install grab bars by the toilet and in the tub and shower.  Do not use towel bars as grab bars. Use non-skid mats or decals in the tub or shower. If you need to sit down in the shower, use a plastic, non-slip stool. Keep the floor dry. Clean up any water that spills on the floor as soon as it happens. Remove soap buildup in the tub or shower regularly. Attach bath mats securely with double-sided non-slip rug tape. Do not have throw rugs and other things on the floor that can make you trip. What can I do in the bedroom? Use night lights. Make sure that you have a light by your bed that is easy to reach. Do not use  any sheets or blankets that are too big for your bed. They should not hang down onto the floor. Have a firm chair that has side arms. You can use this for support while you get dressed. Do not have throw rugs and other things on the floor that can make you trip. What can I do in the kitchen? Clean up any spills right away. Avoid walking on wet floors. Keep items that you use a lot in easy-to-reach places. If you need to reach something above you, use a strong step stool that has a grab bar. Keep electrical cords out of the way. Do not use floor polish or wax that makes floors slippery. If you must use wax, use non-skid floor wax. Do not have throw rugs and other things on the floor that can make you trip. What can I do with my stairs? Do not leave any items on the stairs. Make sure that there are handrails on both sides of the stairs and use them. Fix handrails that are broken or loose. Make sure that handrails are as long as the stairways. Check any carpeting to make sure that it is firmly attached to the stairs. Fix any carpet that is loose or worn. Avoid having throw rugs at the top or bottom of the stairs. If you do have throw rugs, attach them to the floor with carpet tape. Make sure that you have a light switch at the top of the stairs and the bottom of the stairs. If you do not have them, ask someone to add them for you. What else  can I do to help prevent falls? Wear shoes that: Do not have high heels. Have rubber bottoms. Are comfortable and fit you well. Are closed at the toe. Do not wear sandals. If you use a stepladder: Make sure that it is fully opened. Do not climb a closed stepladder. Make sure that both sides of the stepladder are locked into place. Ask someone to hold it for you, if possible. Clearly mark and make sure that you can see: Any grab bars or handrails. First and last steps. Where the edge of each step is. Use tools that help you move around (mobility aids) if they are needed. These include: Canes. Walkers. Scooters. Crutches. Turn on the lights when you go into a dark area. Replace any light bulbs as soon as they burn out. Set up your furniture so you have a clear path. Avoid moving your furniture around. If any of your floors are uneven, fix them. If there are any pets around you, be aware of where they are. Review your medicines with your doctor. Some medicines can make you feel dizzy. This can increase your chance of falling. Ask your doctor what other things that you can do to help prevent falls. This information is not intended to replace advice given to you by your health care provider. Make sure you discuss any questions you have with your health care provider. Document Released: 11/01/2008 Document Revised: 06/13/2015 Document Reviewed: 02/09/2014 Elsevier Interactive Patient Education  2017 [employer].

## 2022-10-07 ENCOUNTER — Other Ambulatory Visit: Payer: Self-pay | Admitting: Family Medicine

## 2022-10-07 DIAGNOSIS — F419 Anxiety disorder, unspecified: Secondary | ICD-10-CM

## 2022-10-22 ENCOUNTER — Telehealth: Payer: Self-pay | Admitting: *Deleted

## 2022-10-22 DIAGNOSIS — M810 Age-related osteoporosis without current pathological fracture: Secondary | ICD-10-CM

## 2022-10-22 NOTE — Telephone Encounter (Signed)
Patient left voicemail stating she is scheduled for a dental procedure on 10/29/22, requesting Rx for prophylaxis abx. Also requesting to schedule Prolia injection.   Spoke with patient.  Last Prolia 10/28/21 AEX 03/06/22 -ML BMD -04/2020 -Osteoporosis, repeat 2 years Dental cleaning scheduled for 10/29/22 Total hip surgery 05/26/22  Advised updated BMD needed, scheduled for 12/22/22 at 0940 at [facility].   Patient is requesting abx prior to dental cleaning, is unsure if this was previously prescribed by [facility] or recommended while on Prolia. Advised will review with provider and f/u. Reviewed previous medications and did not see prophylactic abx on file for dental cleaning.  Next BMD 12/2022, will inquire if OK to proceed with Prolia or if we will need to wait for updated BMD.   Jami -please review and advise on abx and prolia prior to BMD.   Cc: Petra Kuba

## 2022-10-22 NOTE — Telephone Encounter (Signed)
May proceed with prolia before DEXA.  She does not need antibiotic prophylaxis for a dental cleaning, only more involved procedures- root canal, extraction, etc.

## 2022-10-22 NOTE — Telephone Encounter (Signed)
Spoke with patient, advised per Jami. Patient verbalizes understanding and is agreeable.   BMD order placed.   Routing to [place] [facility] to f/u on Prolia.

## 2022-11-10 NOTE — Telephone Encounter (Signed)
Insurance information submitted to [employer] portal. Will await summary of benefits for prolia.    

## 2022-11-17 MED ORDER — DENOSUMAB 60 MG/ML ~~LOC~~ SOSY
60.0000 mg | PREFILLED_SYRINGE | Freq: Once | SUBCUTANEOUS | Status: AC
Start: 2022-11-19 — End: 2022-11-19
  Administered 2022-11-19: 60 mg via SUBCUTANEOUS

## 2022-11-17 NOTE — Telephone Encounter (Signed)
Spoke with patient. Patient scheduled for prolia injection on 11-19-22 at 1145. Patient agreeable to date and time of appointment. Summary of benefits scanned into epic. Future order for prolia placed.   Encounter closed.

## 2022-11-17 NOTE — Telephone Encounter (Signed)
Deductible:  $240 of $240 met   OOP MAX: n/a  Annual exam: 03-06-22 ML  Calcium:       10.0     Date: 08-04-22  Upcoming dental procedures: No   Hx of Kidney Disease: No   Last Bone Density Scan: 04-23-20, scheduled 12-22-22   Is Prior Authorization needed: no  Pt estimated Cost: $0    Coverage Details: This is a Medicare Supplement Plan G and it covers the Medicare Part B coinsurance and 100% of the excess charges. This plan does not cover the Medicare Part B deductible of $240 which $240 is met.

## 2022-11-19 ENCOUNTER — Ambulatory Visit (INDEPENDENT_AMBULATORY_CARE_PROVIDER_SITE_OTHER): Payer: Medicare Other | Admitting: *Deleted

## 2022-11-19 DIAGNOSIS — M810 Age-related osteoporosis without current pathological fracture: Secondary | ICD-10-CM | POA: Diagnosis not present

## 2023-01-03 ENCOUNTER — Other Ambulatory Visit: Payer: Self-pay | Admitting: Family Medicine

## 2023-01-03 DIAGNOSIS — F419 Anxiety disorder, unspecified: Secondary | ICD-10-CM

## 2023-02-25 ENCOUNTER — Encounter: Payer: Self-pay | Admitting: Family Medicine

## 2023-04-15 ENCOUNTER — Telehealth: Payer: Self-pay | Admitting: *Deleted

## 2023-04-15 NOTE — Telephone Encounter (Signed)
Insurance information submitted to [employer] portal. Will await summary of benefits for prolia.    

## 2023-05-03 NOTE — Telephone Encounter (Signed)
 Yes, needs DEXA - ok to have done at [facility]. Schedule med check at some point this summer.

## 2023-05-03 NOTE — Telephone Encounter (Signed)
 Carla Lamb- please advise-- does patient need med check or BMD prior to next prolia injection due on 05/20/23?

## 2023-05-03 NOTE — Telephone Encounter (Signed)
 Deductible:  $257 of $257 met  OOP MAX: n/a  Annual exam: 03/06/22 ML  Calcium:     10.0       Date: 08/04/22  Upcoming dental procedures: No   Hx of Kidney Disease: No   Last Bone Density Scan: 05/19/23   Is Prior Authorization needed: no  Pt estimated Cost: $0    Coverage Details: Prolia  and administration will be subject to a $257 deductible and 20% coinsurance. For the Secondary MD Purchase option, this is a Medicare Supplement Plan G and it covers the Medicare Part B coinsurance and 100% of excess charges. This plan does not cover the Medicare Part B deductible of $257 which $257 is met.

## 2023-05-04 NOTE — Telephone Encounter (Signed)
 Call to patient. Advised patient would need to schedule BMD. Order present in chart. Contact information given for Imaging at [facility] and patient will contact them today to set up an appointment for BMD. Will await recommendations from Jami following BMD results.

## 2023-05-04 NOTE — Telephone Encounter (Signed)
 Patient left voicemail stating she has BMD scheduled 05-19-23 at [facility].

## 2023-05-19 ENCOUNTER — Ambulatory Visit (HOSPITAL_BASED_OUTPATIENT_CLINIC_OR_DEPARTMENT_OTHER)
Admission: RE | Admit: 2023-05-19 | Discharge: 2023-05-19 | Disposition: A | Discharge status: Home / Self Care | Source: Ambulatory Visit | Attending: Radiology | Admitting: Radiology

## 2023-05-19 DIAGNOSIS — M810 Age-related osteoporosis without current pathological fracture: Secondary | ICD-10-CM | POA: Diagnosis present

## 2023-05-25 NOTE — Telephone Encounter (Signed)
 Detailed message left on mobile number per DPR advising patient to return call to review benefits for prolia  and schedule.

## 2023-06-18 ENCOUNTER — Other Ambulatory Visit: Payer: Self-pay | Admitting: *Deleted

## 2023-06-18 DIAGNOSIS — M810 Age-related osteoporosis without current pathological fracture: Secondary | ICD-10-CM

## 2023-06-18 MED ORDER — DENOSUMAB 60 MG/ML ~~LOC~~ SOSY
60.0000 mg | PREFILLED_SYRINGE | SUBCUTANEOUS | Status: AC
Start: 2023-06-21 — End: 2023-06-21
  Administered 2023-06-21: 60 mg via SUBCUTANEOUS

## 2023-06-18 NOTE — Telephone Encounter (Signed)
 See prolia  referral. Patient scheduled for 06/21/23 at 1145.   Encounter closed.

## 2023-06-21 ENCOUNTER — Ambulatory Visit (INDEPENDENT_AMBULATORY_CARE_PROVIDER_SITE_OTHER): Admitting: *Deleted

## 2023-06-21 DIAGNOSIS — M810 Age-related osteoporosis without current pathological fracture: Secondary | ICD-10-CM | POA: Diagnosis not present

## 2023-07-03 ENCOUNTER — Other Ambulatory Visit: Payer: Self-pay | Admitting: Family Medicine

## 2023-07-03 DIAGNOSIS — F419 Anxiety disorder, unspecified: Secondary | ICD-10-CM

## 2023-08-18 ENCOUNTER — Ambulatory Visit: Admitting: Obstetrics and Gynecology

## 2023-08-19 ENCOUNTER — Ambulatory Visit: Payer: Medicare Other

## 2023-08-19 VITALS — Ht 64.5 in | Wt 123.0 lb

## 2023-08-19 DIAGNOSIS — Z0000 Encounter for general adult medical examination without abnormal findings: Secondary | ICD-10-CM

## 2023-08-19 NOTE — Progress Notes (Signed)
 Subjective:   Carla Lamb is a 68 y.o. who presents for a Medicare Wellness preventive visit.  As a reminder, Annual Wellness Visits don't include a physical exam, and some assessments may be limited, especially if this visit is performed virtually. We may recommend an in-person follow-up visit with your provider if needed.  Visit Complete: Virtual I connected with  [name] on 08/19/23 by a audio enabled telemedicine application and verified that I am speaking with the correct person using two identifiers.  Patient Location: Home  Provider Location: Home Office  I discussed the limitations of evaluation and management by telemedicine. The patient expressed understanding and agreed to proceed.  Vital Signs: Because this visit was a virtual/telehealth visit, some criteria may be missing or patient reported. Any vitals not documented were not able to be obtained and vitals that have been documented are patient reported.  VideoDeclined- This patient declined Librarian, academic. Therefore the visit was completed with audio only.  Persons Participating in Visit: Patient.  AWV Questionnaire: Yes: Patient Medicare AWV questionnaire was completed by the patient on 08/19/23; I have confirmed that all information answered by patient is correct and no changes since this date.  Cardiac Risk Factors include: advanced age (>64men, >38 women)     Objective:    Today's Vitals   08/19/23 1357  Weight: 123 lb (55.8 kg)  Height: 5' 4.5 (1.638 m)   Body mass index is 20.79 kg/m.     08/19/2023    2:04 PM 08/13/2022    1:19 PM 05/15/2022    1:15 PM 12/19/2020    2:02 PM 08/25/2011    2:15 PM 08/24/2011    8:31 AM  Advanced Directives  Does Patient Have a Medical Advance Directive? No No No Yes Patient has advance directive, copy not in chart  Patient has advance directive, copy not in chart   Type of Advance Directive    Healthcare Power of [place];Living will  Healthcare Power of [place];Living will  Healthcare Power of [place];Living will   Copy of Healthcare Power of Attorney in Chart?    No - copy requested Copy requested from family  Copy requested from family   Would patient like information on creating a medical advance directive? Yes (MAU/Ambulatory/Procedural Areas - Information given) No - Patient declined Yes (MAU/Ambulatory/Procedural Areas - Information given)     Pre-existing out of facility DNR order (yellow form or pink MOST form)      No      Data saved with a previous flowsheet row definition    Current Medications (verified) Outpatient Encounter Medications as of 08/19/2023  Medication Sig   acetaminophen  (TYLENOL ) 500 MG tablet Take 2 tablets (1,000 mg total) by mouth every 6 (six) hours as needed for mild pain or moderate pain.   cholecalciferol (VITAMIN D3) 25 MCG (1000 UNIT) tablet Take 1,000 Units by mouth every evening.   denosumab  (PROLIA ) 60 MG/ML SOSY injection Inject 60 mg into the skin every 6 (six) months.   DULoxetine  (CYMBALTA ) 20 MG capsule TAKE 1 CAPSULE BY MOUTH EVERY DAY   fluticasone  (FLONASE ) 50 MCG/ACT nasal spray SPRAY 2 SPRAYS INTO EACH NOSTRIL EVERY DAY (Patient taking differently: Place 1 spray into both nostrils every evening.)   Golimumab (SIMPONI ARIA IV) Inject 1 Dose into the vein every 8 (eight) weeks.   hydroxychloroquine (PLAQUENIL) 200 MG tablet Take 400 mg by mouth every evening.   methotrexate  (RHEUMATREX) 2.5 MG tablet Take 15 mg by mouth  every Monday.  Caution:Chemotherapy. Protect from light.   [DISCONTINUED] folic acid (FOLVITE) 1 MG tablet Take 3 mg by mouth every evening.   No facility-administered encounter medications on file as of 08/19/2023.    Allergies (verified) Patient has no known allergies.   History: Past Medical History:  Diagnosis Date   Anxiety    Bursitis    Cancer (HCC)    Right Mastectomy [place] Disease   Depression    During treament   Family history of  adverse reaction to anesthesia    Mother and sister hard to wake up   Fractured elbow    & forearm   Fractured pelvis (HCC)    HSV-1 infection    Osteoporosis 01/2017   2017 T score -2.5 2019 T score -2.3.   RA (rheumatoid arthritis) [facility])    Past Surgical History:  Procedure Laterality Date   ANTERIOR CERVICAL DECOMP/DISCECTOMY FUSION  08/25/2011   Procedure: ANTERIOR CERVICAL DECOMPRESSION/DISCECTOMY FUSION 3 LEVELS;  Surgeon: Catalina CHRISTELLA Stains, MD;  Location: [facility];  Service: Neurosurgery;  Laterality: N/A;  Anterior Cervical Decompression/Discectomy Fusion Cervical Four-Five Cervical Six Corpectomy, Fusion to cervical Seven.   Arm surgery Left 09/08/2021   BREAST SURGERY  2006   bunions     bilat feet   CERVICAL FUSION  08/25/2011   C 4  5 & 6   HIP SURGERY  05/26/2022   MASTECTOMY  01/20/2004   Right   SPINE SURGERY  2012   Neck 3&4   TOTAL HIP ARTHROPLASTY Left 12/31/2020   Procedure: TOTAL HIP ARTHROPLASTY ANTERIOR APPROACH;  Surgeon: Beverley Evalene BIRCH, MD;  Location: [facility];  Service: Orthopedics;  Laterality: Left;   TOTAL HIP ARTHROPLASTY Right 05/26/2022   Procedure: TOTAL HIP ARTHROPLASTY ANTERIOR APPROACH;  Surgeon: Beverley Evalene BIRCH, MD;  Location: [facility];  Service: Orthopedics;  Laterality: Right;   Vaginal Cyst Removed  01/20/2008   Family History  Problem Relation Age of Onset   Cancer Father        LUNG- SMOKER   Cancer Brother 64       RENAL   Breast cancer Mother 17   Hypertension Mother    Alcohol abuse Mother    Asthma Mother    Cancer Maternal Grandfather        Colon   Diverticulitis Sister    Asthma Sister    Alcohol abuse Paternal Grandfather    Arthritis Maternal Aunt    Social History   Socioeconomic History   Marital status: Married    Spouse name: Debby   Number of children: 1   Years of education: 12th   Highest education level: GED or equivalent  Occupational History   Not on file  Tobacco Use   Smoking status: Every  Day    Current packs/day: 0.25    Average packs/day: 0.3 packs/day for 10.0 years (2.5 ttl pk-yrs)    Types: Cigarettes    Passive exposure: Never   Smokeless tobacco: Never  Vaping Use   Vaping status: Never Used  Substance and Sexual Activity   Alcohol use: Not Currently   Drug use: Never   Sexual activity: Not Currently    Partners: Male    Birth control/protection: Post-menopausal, Abstinence    Comment: 1st intercourse 68 yo-Fewer than 5 partners  Other Topics Concern   Not on file  Social History Narrative   Not on file   Social Drivers of Health   Financial Resource Strain: Low Risk  (08/19/2023)  Overall Financial Resource Strain (CARDIA)    Difficulty of Paying Living Expenses: Not very hard  Food Insecurity: No Food Insecurity (08/19/2023)   Hunger Vital Sign    Worried About Running Out of Food in the Last Year: Never true    Ran Out of Food in the Last Year: Never true  Transportation Needs: No Transportation Needs (08/19/2023)   PRAPARE - Administrator, Civil Service (Medical): No    Lack of Transportation (Non-Medical): No  Physical Activity: Insufficiently Active (08/19/2023)   Exercise Vital Sign    Days of Exercise per Week: 2 days    Minutes of Exercise per Session: 10 min  Stress: No Stress Concern Present (08/19/2023)   [employer] of Occupational Health - Occupational Stress Questionnaire    Feeling of Stress: Only a little  Social Connections: Moderately Isolated (08/19/2023)   Social Connection and Isolation Panel    Frequency of Communication with Friends and Family: Three times a week    Frequency of Social Gatherings with Friends and Family: Once a week    Attends Religious Services: Never    Database administrator or Organizations: No    Attends Engineer, structural: Never    Marital Status: Married    Tobacco Counseling Ready to quit: Not Answered Counseling given: Not Answered    Clinical Intake:  Pre-visit  preparation completed: Yes  Pain : No/denies pain  Diabetes: No  Lab Results  Component Value Date   HGBA1C 5.3 08/04/2022     How often do you need to have someone help you when you read instructions, pamphlets, or other written materials from your doctor or pharmacy?: 1 - Never  Interpreter Needed?: No  Information entered by :: Charmaine Bloodgood LPN   Activities of Daily Living     08/19/2023    7:47 AM  In your present state of health, do you have any difficulty performing the following activities:  Hearing? 0  Vision? 0  Difficulty concentrating or making decisions? 0  Walking or climbing stairs? 0  Dressing or bathing? 0  Doing errands, shopping? 0  Preparing Food and eating ? N  Using the Toilet? N  In the past six months, have you accidently leaked urine? Y  Do you have problems with loss of bowel control? Y  Managing your Medications? N  Managing your Finances? N  Housekeeping or managing your Housekeeping? N    Patient Care Team: Gayle Saddie JULIANNA DEVONNA as PCP - General (Physician Assistant) Ishmael Slough, MD as Consulting Physician (Rheumatology) Arelia Filippo, MD as Consulting Physician (Plastic Surgery) Glennon Almarie POUR, MD as Consulting Physician (Obstetrics and Gynecology) Hedgecock, Sabra Hails, PA-C (Hematology and Oncology) [facility]  I have updated your Care Teams any recent Medical Services you may have received from other providers in the past year.     Assessment:   This is a routine wellness examination for [place].  Hearing/Vision screen Hearing Screening - Comments:: Denies hearing difficulties   Vision Screening - Comments:: Wears rx glasses - up to date with routine eye exams with [facility]    Goals Addressed               This Visit's Progress     Maintain health and independence   On track     Stay healthy (pt-stated)   On track      Depression Screen     08/19/2023    2:03 PM 08/13/2022  1:13 PM 04/15/2022   10:40 AM 02/11/2022    8:33 AM 11/11/2021   11:02 AM 10/14/2021   11:53 AM 05/21/2021    9:34 AM  PHQ 2/9 Scores  PHQ - 2 Score 0 0 4 2 2 2 2   PHQ- 9 Score   9 4 6 5 6     Fall Risk     08/19/2023    7:47 AM 08/13/2022    1:15 PM 08/06/2022    6:09 PM 04/15/2022   10:37 AM 03/06/2022    1:25 PM  Fall Risk   Falls in the past year? 1 1 1 1 1   Number falls in past yr: 0 0 0 0 0  Injury with Fall? 0 1 1 1 1   Comment  fx to pelvis, left elbow and forearm. Followed by medical attention     Risk for fall due to : History of fall(s) Mental status change;Orthopedic patient  Impaired balance/gait History of fall(s)  Follow up Falls evaluation completed;Education provided;Falls prevention discussed Falls prevention discussed   Falls evaluation completed    MEDICARE RISK AT HOME:  Medicare Risk at Home Any stairs in or around the home?: (Patient-Rptd) Yes If so, are there any without handrails?: (Patient-Rptd) No Home free of loose throw rugs in walkways, pet beds, electrical cords, etc?: (Patient-Rptd) Yes Adequate lighting in your home to reduce risk of falls?: (Patient-Rptd) Yes Life alert?: (Patient-Rptd) No Use of a cane, walker or w/c?: (Patient-Rptd) No Grab bars in the bathroom?: (Patient-Rptd) No Shower chair or bench in shower?: (Patient-Rptd) No Elevated toilet seat or a handicapped toilet?: (Patient-Rptd) Yes  TIMED UP AND GO:  Was the test performed?  No  Cognitive Function: Declined/Normal: No cognitive concerns noted by patient or family. Patient alert, oriented, able to answer questions appropriately and recall recent events. No signs of memory loss or confusion.        08/13/2022    1:19 PM 05/21/2021    9:35 AM  6CIT Screen  What Year? 0 points 0 points  What month? 0 points 0 points  What time? 0 points 0 points  Count back from 20 0 points 0 points  Months in reverse 0 points 0 points  Repeat phrase 0 points 0 points  Total Score 0 points 0  points    Immunizations Immunization History  Administered Date(s) Administered   Tdap 03/18/2011    Screening Tests Health Maintenance  Topic Date Due   COVID-19 Vaccine (1) Never done   Hepatitis C Screening  Never done   Pneumococcal Vaccine: 50+ Years (1 of 2 - PCV) Never done   Zoster Vaccines- Shingrix (1 of 2) Never done   DTaP/Tdap/Td (2 - Td or Tdap) 03/17/2021   INFLUENZA VACCINE  08/20/2023   MAMMOGRAM  10/17/2023   Medicare Annual Wellness (AWV)  08/18/2024   Colonoscopy  07/16/2031   DEXA SCAN  Completed   Hepatitis B Vaccines  Aged Out   HPV VACCINES  Aged Out   Meningococcal B Vaccine  Aged Out    Health Maintenance  Health Maintenance Due  Topic Date Due   COVID-19 Vaccine (1) Never done   Hepatitis C Screening  Never done   Pneumococcal Vaccine: 50+ Years (1 of 2 - PCV) Never done   Zoster Vaccines- Shingrix (1 of 2) Never done   DTaP/Tdap/Td (2 - Td or Tdap) 03/17/2021   Health Maintenance Items Addressed: Patient declines vaccines   Additional Screening:  Vision Screening: Recommended annual ophthalmology exams  for early detection of glaucoma and other disorders of the eye. Would you like a referral to an eye doctor? No    Dental Screening: Recommended annual dental exams for proper oral hygiene  Community Resource Referral / Chronic Care Management: CRR required this visit?  No   CCM required this visit?  No   Plan:    I have personally reviewed and noted the following in the patient's chart:   Medical and social history Use of alcohol, tobacco or illicit drugs  Current medications and supplements including opioid prescriptions. Patient is not currently taking opioid prescriptions. Functional ability and status Nutritional status Physical activity Advanced directives List of other physicians Hospitalizations, surgeries, and ER visits in previous 12 months Vitals Screenings to include cognitive, depression, and falls Referrals  and appointments  In addition, I have reviewed and discussed with patient certain preventive protocols, quality metrics, and best practice recommendations. A written personalized care plan for preventive services as well as general preventive health recommendations were provided to patient.   Lavelle Pfeiffer [place], [place]   2/68/7974   After Visit Summary: (MyChart) Due to this being a telephonic visit, the after visit summary with patients personalized plan was offered to patient via MyChart   Notes: Nothing significant to report at this time.

## 2023-08-19 NOTE — Patient Instructions (Signed)
 [name] , Thank you for taking time out of your busy schedule to complete your Annual Wellness Visit with me. I enjoyed our conversation and look forward to speaking with you again next year. I, as well as your care team,  appreciate your ongoing commitment to your health goals. Please review the following plan we discussed and let me know if I can assist you in the future. Your Game plan/ To Do List     Follow up Visits: We will see or speak with you next year for your Next Medicare AWV with our clinical staff Have you seen your provider in the last 6 months (3 months if uncontrolled diabetes)? No; scheduled for 10/11/23 @ 2:30  Clinician Recommendations:  Aim for 30 minutes of exercise or brisk walking, 6-8 glasses of water , and 5 servings of fruits and vegetables each day.       This is a list of the screenings recommended for you:  Health Maintenance  Topic Date Due   COVID-19 Vaccine (1) Never done   Hepatitis C Screening  Never done   Pneumococcal Vaccine for age over 46 (1 of 2 - PCV) Never done   Zoster (Shingles) Vaccine (1 of 2) Never done   DTaP/Tdap/Td vaccine (2 - Td or Tdap) 03/17/2021   Flu Shot  08/20/2023   Mammogram  10/17/2023   Medicare Annual Wellness Visit  08/18/2024   Colon Cancer Screening  07/16/2031   DEXA scan (bone density measurement)  Completed   Hepatitis B Vaccine  Aged Out   HPV Vaccine  Aged Out   Meningitis B Vaccine  Aged Out    Advanced directives: (ACP Link)Information on Advanced Care Planning can be found at [employer] Care Directives Advance Health Care Directives. [email or web address]   Advance Care Planning is important because it:  [x]  Makes sure you receive the medical care that is consistent with your values, goals, and preferences  [x]  It provides guidance to your family and loved ones and reduces their decisional burden about whether or not they are making the right decisions based on your  wishes.  Follow the link provided in your after visit summary or read over the paperwork we have mailed to you to help you started getting your Advance Directives in place. If you need assistance in completing these, please reach out to us  so that we can help you!  See attachments for Preventive Care and Fall Prevention Tips.

## 2023-08-31 ENCOUNTER — Ambulatory Visit (INDEPENDENT_AMBULATORY_CARE_PROVIDER_SITE_OTHER): Admitting: Obstetrics and Gynecology

## 2023-08-31 ENCOUNTER — Encounter: Payer: Self-pay | Admitting: Obstetrics and Gynecology

## 2023-08-31 VITALS — BP 124/78 | HR 72 | Ht 64.5 in | Wt 118.0 lb

## 2023-08-31 DIAGNOSIS — M810 Age-related osteoporosis without current pathological fracture: Secondary | ICD-10-CM

## 2023-08-31 DIAGNOSIS — L309 Dermatitis, unspecified: Secondary | ICD-10-CM

## 2023-08-31 MED ORDER — CETIRIZINE HCL 10 MG PO TABS: 10.0000 mg | ORAL_TABLET | Freq: Every day | ORAL | 3 refills | Status: AC

## 2023-08-31 NOTE — Progress Notes (Signed)
   Acute Office Visit  Subjective:    Patient ID: Carla Lamb, female    DOB: 07-24-1955, 68 y.o.   MRN: [id number]   HPI 68 y.o. presents today for Follow-up (One year medication follow up - Prolia  injections  - reports red rash on both arms since getting last injection in June. ) . Reports has had prolia  in the past and had it recently in June and then this month has had a petechial rash on both arms that she has prurits with and is getting better. Has not changed anything ie soaps, powders and denies any heat exposure.  No LMP recorded. Patient is postmenopausal.    Review of Systems+     Objective:    OBGyn Exam  BP 124/78 (BP Location: Left Arm, Patient Position: Sitting, Cuff Size: Normal)   Pulse 72   Ht 5' 4.5 (1.638 m)   Wt 118 lb (53.5 kg)   SpO2 98%   BMI 19.94 kg/m  Wt Readings from Last 3 Encounters:  08/31/23 118 lb (53.5 kg)  08/19/23 123 lb (55.8 kg)  08/13/22 123 lb (55.8 kg)       Both arms with dry petechial rash no pustules  Assessment & Plan:  Dermatitis Referral for dermatology and allergy testing.  Begin daily zyrtec  Continue prolia    Dr. Glennon Almarie MARLA Glennon

## 2023-09-05 IMAGING — RF DG HIP (WITH OR WITHOUT PELVIS) 1V*L*
1 series · 2 of 2 positions shown · non-contrast
Comparison: No comparison studies available.

CLINICAL DATA: Left hip replacement.

EXAM:
DG HIP (WITH OR WITHOUT PELVIS) 1V*L*

[Series 1: unknown protocol · 0.20mm/px · 2 of 2 slices shown]
[im 1/2]
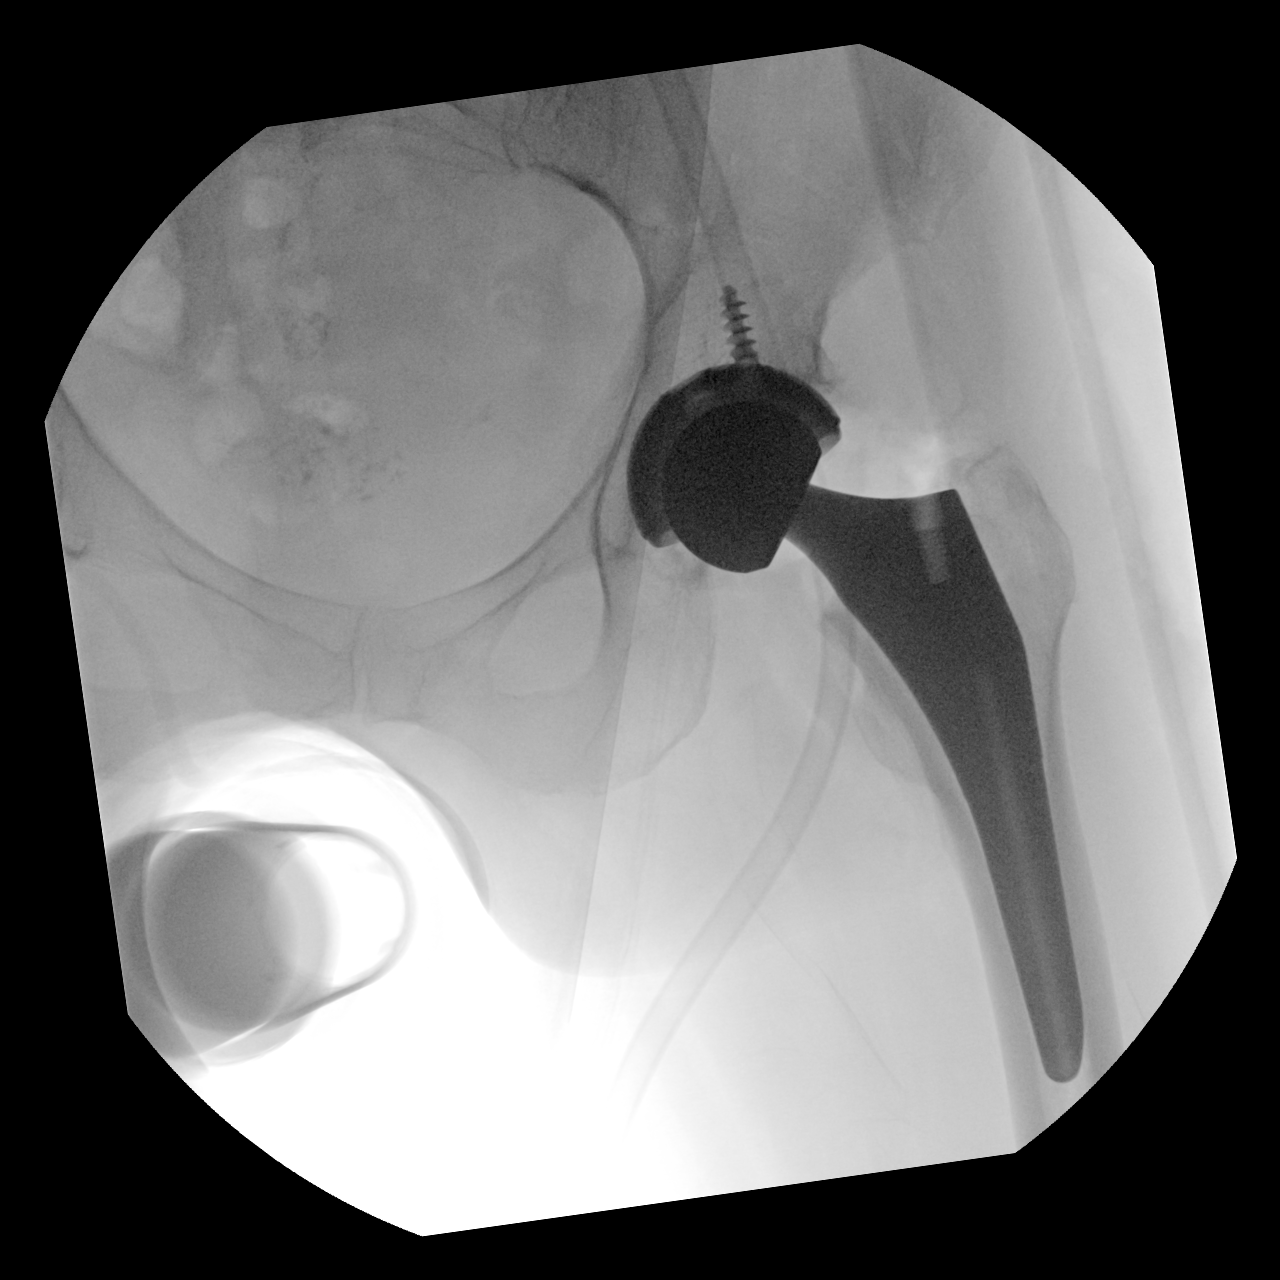
[im 2/2]
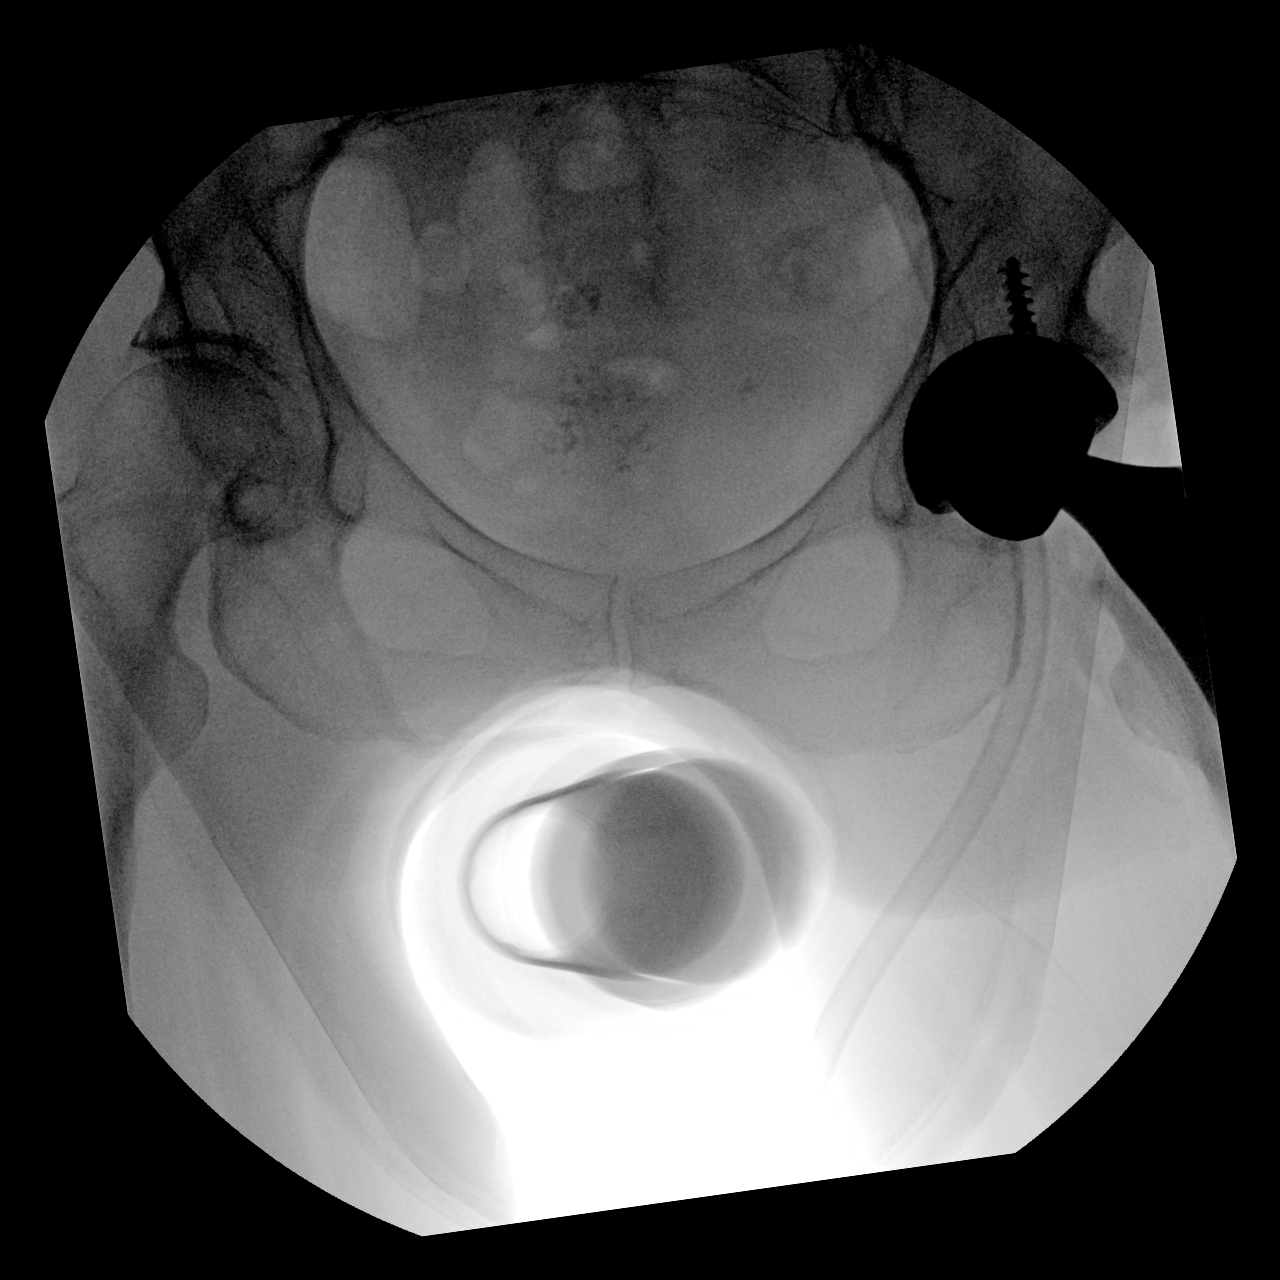

[2 of 2 positions shown; findings below may reference images not displayed]

FINDINGS: 2 intraoperative spot fluoro films show left total hip replacement.
No evidence for immediate hardware complication. Symphysis pubis
unremarkable.
IMPRESSION: Intraoperative assessment during left total hip replacement.

## 2023-09-08 ENCOUNTER — Encounter: Payer: Self-pay | Admitting: Dermatology

## 2023-09-08 ENCOUNTER — Ambulatory Visit: Admitting: Dermatology

## 2023-09-08 VITALS — BP 151/77

## 2023-09-08 DIAGNOSIS — L309 Dermatitis, unspecified: Secondary | ICD-10-CM

## 2023-09-08 DIAGNOSIS — L720 Epidermal cyst: Secondary | ICD-10-CM

## 2023-09-08 MED ORDER — TRIAMCINOLONE ACETONIDE 0.1 % EX OINT: TOPICAL_OINTMENT | CUTANEOUS | 2 refills | Status: DC

## 2023-09-08 NOTE — Progress Notes (Signed)
   New Patient Visit   Subjective  Carla Lamb is a 68 y.o. female who presents for a NEW PATIENT appointment to be examined for the concerns as listed below.   Rash: Patient has rash on B/L arms that she would like evaluated. It first appeared 1 month ago. The only change she can recall is starting OTC folate & glucosamine supplement. She has used OTC cortisone on the areas as well as her husbands [facility]MC - which provided relief. She stated the areas were itchy rating it 9 out of 10.   Add On: Cyst at the nape & back that she wished to discuss removal.   Patient denied Hx of Bx. Patient denied family Hx of skin cancer.    The following portions of the chart were reviewed this encounter and updated as appropriate: medications, allergies, medical history  Review of Systems:  No other skin or systemic complaints except as noted in HPI or Assessment and Plan.  Objective  Well appearing patient in no apparent distress; mood and affect are within normal limits.   A focused examination was performed of the following areas: B/L arms   Relevant exam findings are noted in the Assessment and Plan.                        Assessment & Plan   1. Irritant Contact Dermatitis (Resolved ) - Assessment: Patient reported a rash on the arms that has now subsided. The rash was itchy but has resolved. Possible etiologies include polymorphic light eruption or contact dermatitis from fragrance in lotions reacting with sun exposure. Patient started folate and glucosamine supplements 1-2 months before the rash appeared but continued taking them, and the rash resolved. Patient also reported using Bath and Body Cream, which contains fragrances that could have contributed to the rash. The rash did not affect the face, possibly due to makeup use. No evidence of psoriasis was observed.  - Plan:    Provided samples of CeraVe Anti-Itch lotion containing ceramides and pramoxine for daily use for itch  relief    If rash recurs, prescribe triamcinolone  to apply twice daily for two weeks, then take a two-week break    Recommend daily use of moisturizer    Follow up for skin cancer screening within the next couple of months  2. Epidermal Inclusion Cysts - Assessment: Patient presents with two epidermal inclusion cysts present for years. One cyst, located on the nape of the neck, measures 1 cm and has been previously drained, with occasional spontaneous drainage. The second cyst, located on the back, measures 3.5 cm and has not been drained but produces an odor. - Plan:    Refer to Dr. Lowery for surgical removal of both cysts    Patient instructed to call Dr. Ace office to schedule appointment      No follow-ups on file.   Documentation: I have reviewed the above documentation for accuracy and completeness, and I agree with the above.  I, Shirron Maranda, CMA, am acting as scribe for [employer], DO.   Delon Lenis, DO

## 2023-09-08 NOTE — Patient Instructions (Addendum)
 Date: Wed Sep 08 2023  Hello Carla Lamb,  Thank you for visiting today. Here is a summary of the key instructions:  - Medications:   - Use CeraVe Anti-Itch cream daily for itching   - If rash returns, use triamcinolone  twice a day for two weeks, then take a two-week break  - Skin Care:   - Avoid using fragranced lotions or creams   - Use moisturizer every day  - Follow-up:   - Return for skin cancer screening after cyst removal surgeries   - Schedule follow-up appointment within the next couple of months   - See Dr. Lowery, plastic surgeon, for removal of cysts   - Call Dr. Ace office to schedule appointment  Please reach out if you have any questions or concerns.  Warm regards,  Dr. Delon [facility]    Dr Estefana Lowery [facility] [address] [place], [place] [address] [phone]   Important Information  Due to recent changes in healthcare laws, you may see results of your pathology and/or laboratory studies on MyChart before the doctors have had a chance to review them. We understand that in some cases there may be results that are confusing or concerning to you. Please understand that not all results are received at the same time and often the doctors may need to interpret multiple results in order to provide you with the best plan of care or course of treatment. Therefore, we ask that you please give us  2 business days to thoroughly review all your results before contacting the office for clarification. Should we see a critical lab result, you will be contacted sooner.   If You Need Anything After Your Visit  If you have any questions or concerns for your doctor, please call our main line at [phone] If no one answers, please leave a voicemail as directed and we will return your call as soon as possible. Messages left after 4 pm will be answered the following business day.   You may also send us  a  message via MyChart. We typically respond to MyChart messages within 1-2 business days.  For prescription refills, please ask your pharmacy to contact our office. Our fax number is [phone].  If you have an urgent issue when the clinic is closed that cannot wait until the next business day, you can page your doctor at the number below.    Please note that while we do our best to be available for urgent issues outside of office hours, we are not available 24/7.   If you have an urgent issue and are unable to reach us , you may choose to seek medical care at your doctor's office, retail clinic, urgent care center, or emergency room.  If you have a medical emergency, please immediately call 911 or go to the emergency department. In the event of inclement weather, please call our main line at [phone] for an update on the status of any delays or closures.  Dermatology Medication Tips: Please keep the boxes that topical medications come in in order to help keep track of the instructions about where and how to use these. Pharmacies typically print the medication instructions only on the boxes and not directly on the medication tubes.   If your medication is too expensive, please contact our office at [phone] or send us  a message through MyChart.   We are unable to tell what your co-pay for medications will be in advance as this is different  depending on your insurance coverage. However, we may be able to find a substitute medication at lower cost or fill out paperwork to get insurance to cover a needed medication.   If a prior authorization is required to get your medication covered by your insurance company, please allow us  1-2 business days to complete this process.  Drug prices often vary depending on where the prescription is filled and some pharmacies may offer cheaper prices.  The website www.goodrx.com contains coupons for medications through different pharmacies. The prices here  do not account for what the cost may be with help from insurance (it may be cheaper with your insurance), but the website can give you the price if you did not use any insurance.  - You can print the associated coupon and take it with your prescription to the pharmacy.  - You may also stop by our office during regular business hours and pick up a GoodRx coupon card.  - If you need your prescription sent electronically to a different pharmacy, notify our office through [facility] or by phone at [phone]

## 2023-09-14 ENCOUNTER — Telehealth: Payer: Self-pay

## 2023-09-14 NOTE — Telephone Encounter (Signed)
 called lvmail 09-14-23 Dr CHRISTELLA needs moved to 830am due to sx

## 2023-09-17 ENCOUNTER — Institutional Professional Consult (permissible substitution): Admitting: Student

## 2023-09-17 ENCOUNTER — Ambulatory Visit

## 2023-09-17 VITALS — BP 148/87 | HR 84 | Ht 64.0 in | Wt 121.0 lb

## 2023-09-17 DIAGNOSIS — R222 Localized swelling, mass and lump, trunk: Secondary | ICD-10-CM | POA: Diagnosis not present

## 2023-09-17 DIAGNOSIS — R221 Localized swelling, mass and lump, neck: Secondary | ICD-10-CM | POA: Diagnosis not present

## 2023-09-17 NOTE — Progress Notes (Addendum)
 NAME: Carla Lamb  MRN: [id number]  DOB: [birth year]    Referring physician:??Alm Delon SAILOR, DO  PCP:?Clapp, Saddie FALCON, PA-C    CHIEF COMPLAINT:Masses on neck and back   HPI:?  This is a 67 y.o. year old female with PMH and PSH as below who presents in consultation for masses on posterior neck and back.   It has been present for 5 years. Denies History of trauma/surgery/piercing in the area.  Is the keloid painful, itchy, or tender? Uncomfortable and the posterior neck mass gets infected. Had to be drained by a dermatologist before.  Has the size changed over time? Yes, both masses are increasing in size   Previous treatments? (steroid injections, silicone sheets, surgery)? I&D of the posterior neck mass  Do any family members have a history of masses? Denies  Patient denies h/o HTN, coagulopathies, or thyroid conditions. Not on any anticoagulation.  Patient has rheumatoid arthritis and is on immunomodulator's.    Patient is a smoker 6 cigarettes a week.   Family History:   Family history is negative for bleeding/clotting disorders, problems with anesthesia, connective tissue disorders.     Social History:   Social History   Socioeconomic History   Marital status: Married    Spouse name: Debby   Number of children: 1   Years of education: 12th   Highest education level: GED or equivalent  Occupational History   Not on file  Tobacco Use   Smoking status: Every Day    Current packs/day: 0.25    Average packs/day: 0.3 packs/day for 10.0 years (2.5 ttl pk-yrs)    Types: Cigarettes    Passive exposure: Never   Smokeless tobacco: Never  Vaping Use   Vaping status: Never Used  Substance and Sexual Activity   Alcohol use: Not Currently   Drug use: Never   Sexual activity: Not Currently    Partners: Male    Birth control/protection: Post-menopausal, Abstinence    Comment: 1st intercourse 68 yo-Fewer than 5 partners  Other Topics Concern   Not on file  Social  History Narrative   Not on file   Social Drivers of Health   Financial Resource Strain: Low Risk  (08/19/2023)   Overall Financial Resource Strain (CARDIA)    Difficulty of Paying Living Expenses: Not very hard  Food Insecurity: No Food Insecurity (08/19/2023)   Hunger Vital Sign    Worried About Running Out of Food in the Last Year: Never true    Ran Out of Food in the Last Year: Never true  Transportation Needs: No Transportation Needs (08/19/2023)   PRAPARE - Administrator, Civil Service (Medical): No    Lack of Transportation (Non-Medical): No  Physical Activity: Insufficiently Active (08/19/2023)   Exercise Vital Sign    Days of Exercise per Week: 2 days    Minutes of Exercise per Session: 10 min  Stress: No Stress Concern Present (08/19/2023)   [employer] of Occupational Health - Occupational Stress Questionnaire    Feeling of Stress: Only a little  Social Connections: Moderately Isolated (08/19/2023)   Social Connection and Isolation Panel    Frequency of Communication with Friends and Family: Three times a week    Frequency of Social Gatherings with Friends and Family: Once a week    Attends Religious Services: Never    Database administrator or Organizations: No    Attends Banker Meetings: Never    Marital Status: Married    Smoker/Vape Yes  Recreational Drug use: Denies   PMH:  Past Medical History:  Diagnosis Date   Anxiety    Bursitis    Cancer (HCC)    Right Mastectomy Padgets Disease   Depression    During treament   Family history of adverse reaction to anesthesia    Mother and sister hard to wake up   Fractured elbow    & forearm   Fractured pelvis (HCC)    HSV-1 infection    Osteoporosis 01/2017   2017 T score -2.5 2019 T score -2.3.   RA (rheumatoid arthritis) (HCC)       PSH:  Past Surgical History:  Procedure Laterality Date   ANTERIOR CERVICAL DECOMP/DISCECTOMY FUSION  08/25/2011   Procedure: ANTERIOR CERVICAL  DECOMPRESSION/DISCECTOMY FUSION 3 LEVELS;  Surgeon: Catalina CHRISTELLA Stains, MD;  Location: [facility];  Service: Neurosurgery;  Laterality: N/A;  Anterior Cervical Decompression/Discectomy Fusion Cervical Four-Five Cervical Six Corpectomy, Fusion to cervical Seven.   Arm surgery Left 09/08/2021   BREAST SURGERY  2006   bunions     bilat feet   CERVICAL FUSION  08/25/2011   C 4  5 & 6   HIP SURGERY  05/26/2022   MASTECTOMY  01/20/2004   Right   SPINE SURGERY  2012   Neck 3&4   TOTAL HIP ARTHROPLASTY Left 12/31/2020   Procedure: TOTAL HIP ARTHROPLASTY ANTERIOR APPROACH;  Surgeon: Beverley Evalene BIRCH, MD;  Location: [facility];  Service: Orthopedics;  Laterality: Left;   TOTAL HIP ARTHROPLASTY Right 05/26/2022   Procedure: TOTAL HIP ARTHROPLASTY ANTERIOR APPROACH;  Surgeon: Beverley Evalene BIRCH, MD;  Location: [facility];  Service: Orthopedics;  Laterality: Right;   Vaginal Cyst Removed  01/20/2008      MEDICATIONS:?   Current Outpatient Medications:    acetaminophen  (TYLENOL ) 500 MG tablet, Take 2 tablets (1,000 mg total) by mouth every 6 (six) hours as needed for mild pain or moderate pain., Disp: 30 tablet, Rfl: 0   cetirizine  (ZYRTEC  ALLERGY ) 10 MG tablet, Take 1 tablet (10 mg total) by mouth daily., Disp: 60 tablet, Rfl: 3   cholecalciferol (VITAMIN D3) 25 MCG (1000 UNIT) tablet, Take 1,000 Units by mouth every evening., Disp: , Rfl:    denosumab  (PROLIA ) 60 MG/ML SOSY injection, Inject 60 mg into the skin every 6 (six) months., Disp: , Rfl:    DULoxetine  (CYMBALTA ) 20 MG capsule, TAKE 1 CAPSULE BY MOUTH EVERY DAY, Disp: 90 capsule, Rfl: 1   fluticasone  (FLONASE ) 50 MCG/ACT nasal spray, SPRAY 2 SPRAYS INTO EACH NOSTRIL EVERY DAY (Patient taking differently: Place 1 spray into both nostrils every evening.), Disp: 48 mL, Rfl: 2   FOLIC ACID PO, Take by mouth daily., Disp: , Rfl:    Glucosamine HCl-MSM 1500-500 MG/30ML LIQD, Take 1,500 mg by mouth daily., Disp: , Rfl:    Golimumab (SIMPONI ARIA IV),  Inject 1 Dose into the vein every 8 (eight) weeks., Disp: , Rfl:    hydroxychloroquine (PLAQUENIL) 200 MG tablet, Take 400 mg by mouth every evening., Disp: , Rfl:    methotrexate  (RHEUMATREX) 2.5 MG tablet, Take 15 mg by mouth every Monday.  Caution:Chemotherapy. Protect from light., Disp: , Rfl:    triamcinolone  ointment (KENALOG ) 0.1 %, Use BID for 2 weeks on 2 weeks off. Repeat PRN., Disp: 80 g, Rfl: 2    ALLERGIES:?  has no known allergies.    REVIEW OF SYSTEMS:  Review of Systems  ROS negative except as noted in HPI   VITALS:  Blood pressure ROLLEN)  148/87, pulse 84, height 5' 4 (1.626 m), weight 121 lb (54.9 kg), SpO2 99%.   BP (!) 148/87 (BP Location: Left Arm, Patient Position: Sitting, Cuff Size: Normal)   Pulse 84   Ht 5' 4 (1.626 m)   Wt 121 lb (54.9 kg)   SpO2 99%   BMI 20.77 kg/m   Body mass index is 20.77 kg/m.  General: Well appearing, no apparent distress.  Neuro: A&Ox3 HEENT: Normocephalic, atraumatic, PERRL.  Mass location & Size: Posterior neck mass 1 cm x 1 cm raised, shiny, firm, or erythematous, edges well-defined not spreading beyond the original wound, no ulceration, infection, or secondary changes. Not fixed to underlying structures, mobile. Scar present from previous I&D. Posterior back mass 2 cm x 2 cm raised, shiny, firm, or erythematous, edges well-defined not spreading beyond the original wound, no ulceration, infection, or secondary changes. Not fixed to underlying structures, mobile.    ASSESSMENT/PLAN  Assessment & Plan    Today we discussed the risks, benefits and alternatives to posterior neck and back mass excision and possible tissue rearrangement. We discussed the alternatives which include continued observation; however, I told the patient that I do not believe these masses will resolve on its own. We then discussed the benefits of surgical excision which include complete removal of the lesion. We discussed the risks of excision which include  seroma, hematoma, infection, bleeding, damage to surrounding healthy tissue and need for further surgery. We also discussed the risks of hair loss, nerve injuries, chronic pain or anesthesia, wound separation and recurrence of the masses. We discussed scar patterns of tissue rearrangement, if needed. I explained that the masses will be sent to pathology and if they were to be malignant further surgery may be needed. We discussed the risks of anesthesia which will be further elaborated on by our anesthesia colleagues. The patient have a good understanding of all the risks and benefits, postoperative course and care. We obtained pictures. All questions were answered.    The plan agreed upon includes:  Rheumatology clearance, stop immunomodulator's if possible 2 weeks before and 2 weeks after surgery.  Stop smoking 6 weeks before and 6 weeks after surgery. We will order a cotinine test 2 weeks before surgery. Plan for posterior neck and back mass excisions, possible tissue rearrangement.   I spent 30 minutes counseling the patient and discussing plan of care.  Marnie Fazzino M Lotoya Casella [facility]

## 2023-09-21 ENCOUNTER — Ambulatory Visit (INDEPENDENT_AMBULATORY_CARE_PROVIDER_SITE_OTHER): Admitting: Allergy & Immunology

## 2023-09-21 ENCOUNTER — Telehealth: Payer: Self-pay

## 2023-09-21 ENCOUNTER — Encounter: Payer: Self-pay | Admitting: Allergy & Immunology

## 2023-09-21 ENCOUNTER — Other Ambulatory Visit: Payer: Self-pay

## 2023-09-21 VITALS — BP 130/82 | HR 84 | Temp 97.9°F | Resp 18 | Ht 64.17 in | Wt 121.2 lb

## 2023-09-21 DIAGNOSIS — R21 Rash and other nonspecific skin eruption: Secondary | ICD-10-CM | POA: Diagnosis not present

## 2023-09-21 DIAGNOSIS — J310 Chronic rhinitis: Secondary | ICD-10-CM | POA: Diagnosis not present

## 2023-09-21 NOTE — Progress Notes (Signed)
 NEW PATIENT  Date of Service/Encounter:  09/21/23  Consult requested by: Gayle Saddie FALCON, PA-C   Assessment:   Chronic rhinitis - planning for skin testing at the next visit  Rash x 2-3 weeks  Right breast cancer - s/p chemo and mastectomy alone  Rheumatoid arthritis - diagnosed in 2009  Osteoporosis - on Prolia    Plan/Recommendations:   1. Seasonal and perennial allergic rhinitis - Because of insurance stipulations, we cannot do skin testing on the same day as your first visit. - We are all working to fight this, but for now we need to do two separate visits.  - We will know more after we do testing at the next visit.  - The skin testing visit can be squeezed in at your convenience.  - Then we can make a more full plan to address all of your symptoms. - Be sure to stop your antihistamines for 3 days before this appointment.   2. Rash - We may consider doing patch testing at some point in the future.  - Continue with triamcinolone  0.1% cream to use twice daily.  3. Return in about 1 week (around 09/28/2023) for ALLERGY TESTING (1-55 + 17). You can have the follow up appointment with Dr. Iva or a Nurse Practicioner (our Nurse Practitioners are excellent and always have Physician oversight!).   This note in its entirety was forwarded to the Provider who requested this consultation.  Subjective:   Carla Lamb is a 68 y.o. female presenting today for evaluation of  Chief Complaint  Patient presents with   Nasal Congestion    Skin rash on arms   Allergies    In spring and fall    Carla Lamb has a history of the following: Patient Active Problem List   Diagnosis Date Noted   S/P total right hip arthroplasty 05/26/2022   Anxiety 04/15/2022   Mild episode of depression 03/13/2022   Chronic allergic rhinitis 11/30/2021   Fatty liver 05/21/2021   Low back pain, unspecified 05/21/2021   Osteoporosis 05/21/2021   Primary osteoarthritis 05/21/2021    Raynaud's disease 05/21/2021   Rheumatoid arthritis (HCC) 05/21/2021   Status post right mastectomy 09/11/2017   Malignant neoplasm of right breast (HCC) 07/25/2013    History obtained from: chart review and patient and her husband.  Discussed the use of AI scribe software for clinical note transcription with the patient and/or guardian, who gave verbal consent to proceed.  Carla Lamb was referred by Gayle Saddie FALCON, PA-C.     Carla Lamb is a 68 y.o. female presenting for an evaluation of a rash on her arms. She is accompanied by her husband. She was referred by her gynecologist for evaluation of a rash noticed during a Prolia  injection appointment.  She developed a rash on her arms, first noticed during a Prolia  injection appointment in August. The rash persisted for over two weeks, characterized by intense itching and located on both arms. It has mostly resolved, leaving some red spots, with no blistering or exudate, but was palpable. A steroid cream prescribed by her doctor alleviated the symptoms.  She has a history of rheumatoid arthritis diagnosed in 2009 and is on medication for this condition. There have been no previous rashes associated with her rheumatoid arthritis medications. She recently started taking glucosamine and folic acid over the counter, initiated over a month before the rash appeared. She denies any recent exposure to unusual substances or foods that could have caused the rash.  Allergic Rhinitis Symptom History: She experienced fluid in her left ear in 2023 and has hay fever symptoms like sneezing and a runny nose, though she has never been formally allergy tested.  Infection Symptom History: No recent sinus or ear infections.   Her medical history includes breast cancer treated with chemotherapy in 2006, followed by a right breast mastectomy.   She recalls a severe rash from poison oak in her 30s, affecting her legs and ears, but has not had similar rashes  recently.  Otherwise, there is no history of other atopic diseases, including asthma, food allergies, drug allergies, stinging insect allergies, or contact dermatitis. There is no significant infectious history. Vaccinations are up to date.    Past Medical History: Patient Active Problem List   Diagnosis Date Noted   S/P total right hip arthroplasty 05/26/2022   Anxiety 04/15/2022   Mild episode of depression 03/13/2022   Chronic allergic rhinitis 11/30/2021   Fatty liver 05/21/2021   Low back pain, unspecified 05/21/2021   Osteoporosis 05/21/2021   Primary osteoarthritis 05/21/2021   Raynaud's disease 05/21/2021   Rheumatoid arthritis (HCC) 05/21/2021   Status post right mastectomy 09/11/2017   Malignant neoplasm of right breast (HCC) 07/25/2013    Medication List:  Allergies as of 09/21/2023   No Known Allergies      Medication List        Accurate as of September 21, 2023  2:52 PM. If you have any questions, ask your nurse or doctor.          acetaminophen  500 MG tablet Commonly known as: TYLENOL  Take 2 tablets (1,000 mg total) by mouth every 6 (six) hours as needed for mild pain or moderate pain.   cetirizine  10 MG tablet Commonly known as: ZyrTEC  Allergy Take 1 tablet (10 mg total) by mouth daily.   cholecalciferol 25 MCG (1000 UNIT) tablet Commonly known as: VITAMIN D3 Take 1,000 Units by mouth every evening.   denosumab  60 MG/ML Sosy injection Commonly known as: PROLIA  Inject 60 mg into the skin every 6 (six) months.   DULoxetine  20 MG capsule Commonly known as: CYMBALTA  TAKE 1 CAPSULE BY MOUTH EVERY DAY   fluticasone  50 MCG/ACT nasal spray Commonly known as: FLONASE  SPRAY 2 SPRAYS INTO EACH NOSTRIL EVERY DAY What changed: See the new instructions.   FOLIC ACID PO Take by mouth daily.   Glucosamine HCl-MSM 1500-500 MG/30ML Liqd Take 1,500 mg by mouth daily.   hydroxychloroquine 200 MG tablet Commonly known as: PLAQUENIL Take 400 mg by mouth  every evening.   methotrexate  2.5 MG tablet Commonly known as: RHEUMATREX Take 15 mg by mouth every Monday.  Caution:Chemotherapy. Protect from light.   SIMPONI ARIA IV Inject 1 Dose into the vein every 8 (eight) weeks.   triamcinolone  ointment 0.1 % Commonly known as: KENALOG  Use BID for 2 weeks on 2 weeks off. Repeat PRN.        Birth History: non-contributory  Developmental History: non-contributory  Past Surgical History: Past Surgical History:  Procedure Laterality Date   ANTERIOR CERVICAL DECOMP/DISCECTOMY FUSION  08/25/2011   Procedure: ANTERIOR CERVICAL DECOMPRESSION/DISCECTOMY FUSION 3 LEVELS;  Surgeon: Catalina CHRISTELLA Stains, MD;  Location: [facility];  Service: Neurosurgery;  Laterality: N/A;  Anterior Cervical Decompression/Discectomy Fusion Cervical Four-Five Cervical Six Corpectomy, Fusion to cervical Seven.   Arm surgery Left 09/08/2021   BREAST SURGERY  2006   bunions     bilat feet   CERVICAL FUSION  08/25/2011   C 4  5 &  6   HIP SURGERY  05/26/2022   MASTECTOMY  01/20/2004   Right   SPINE SURGERY  2012   Neck 3&4   TOTAL HIP ARTHROPLASTY Left 12/31/2020   Procedure: TOTAL HIP ARTHROPLASTY ANTERIOR APPROACH;  Surgeon: Beverley Evalene BIRCH, MD;  Location: [facility];  Service: Orthopedics;  Laterality: Left;   TOTAL HIP ARTHROPLASTY Right 05/26/2022   Procedure: TOTAL HIP ARTHROPLASTY ANTERIOR APPROACH;  Surgeon: Beverley Evalene BIRCH, MD;  Location: [facility];  Service: Orthopedics;  Laterality: Right;   Vaginal Cyst Removed  01/20/2008     Family History: Family History  Problem Relation Age of Onset   Cancer Father        LUNG- SMOKER   Cancer Brother 31       RENAL   Breast cancer Mother 76   Hypertension Mother    Alcohol abuse Mother    Asthma Mother    Cancer Maternal Grandfather        Colon   Diverticulitis Sister    Asthma Sister    Alcohol abuse Paternal Grandfather    Arthritis Maternal Aunt      Social History: Karlyn lives at home with her  family.  She lives in a house that is 70 years old.  There is carpeting in the main living areas as well as the bedroom.  She has electric heating and central cooling.  There are no animals inside or outside of the home.  There are no dust mite covers in the bedding.  There is no tobacco exposure.  She is retired as of the beginning of August from a job where she worked in [employer]	[place].  [place]  There is no fume, chemical, or dust exposure.  There is no HEPA filter in the home.   Review of systems otherwise negative other than that mentioned in the HPI.    Objective:   Blood pressure 130/82, pulse 84, temperature 97.9 F (36.6 C), temperature source Temporal, resp. rate 18, height 5' 4.17 (1.63 m), weight 121 lb 3.2 oz (55 kg), SpO2 99%. Body mass index is 20.69 kg/m.     Physical Exam Vitals reviewed.  Constitutional:      Appearance: She is well-developed.     Comments: Pleasant.  Cooperative with the exam.  HENT:     Head: Normocephalic and atraumatic.     Right Ear: Tympanic membrane, ear canal and external ear normal. No drainage, swelling or tenderness. Tympanic membrane is not injected, scarred, erythematous, retracted or bulging.     Left Ear: Tympanic membrane, ear canal and external ear normal. No drainage, swelling or tenderness. Tympanic membrane is not injected, scarred, erythematous, retracted or bulging.     Nose: No nasal deformity, septal deviation, mucosal edema or rhinorrhea.     Right Turbinates: Enlarged, swollen and pale.     Left Turbinates: Enlarged, swollen and pale.     Right Sinus: No maxillary sinus tenderness or frontal sinus tenderness.     Left Sinus: No maxillary sinus tenderness or frontal sinus tenderness.     Mouth/Throat:     Mouth: Mucous membranes are not pale and not dry.     Pharynx: Uvula midline.  Eyes:     General:        Right eye: No discharge.        Left eye: No discharge.      Conjunctiva/sclera: Conjunctivae normal.     Right eye: Right conjunctiva is not injected. No  chemosis.    Left eye: Left conjunctiva is not injected. No chemosis.    Pupils: Pupils are equal, round, and reactive to light.  Cardiovascular:     Rate and Rhythm: Normal rate and regular rhythm.     Heart sounds: Normal heart sounds.  Pulmonary:     Effort: Pulmonary effort is normal. No tachypnea, accessory muscle usage or respiratory distress.     Breath sounds: Normal breath sounds. No wheezing, rhonchi or rales.     Comments: Moving air well in all lung fields.  No increased work of breathing. Chest:     Chest wall: No tenderness.  Abdominal:     Tenderness: There is no abdominal tenderness. There is no guarding or rebound.  Lymphadenopathy:     Head:     Right side of head: No submandibular, tonsillar or occipital adenopathy.     Left side of head: No submandibular, tonsillar or occipital adenopathy.     Cervical: No cervical adenopathy.  Skin:    General: Skin is warm.     Capillary Refill: Capillary refill takes less than 2 seconds.     Coloration: Skin is not pale.     Findings: No abrasion, erythema, petechiae or rash. Rash is not papular, urticarial or vesicular.     Comments: No eczematous or urticarial lesions noted.  She does show me pictures of the rash that is since resolved on the bilateral arms.  It seems to be flat and erythematous.  Neurological:     Mental Status: She is alert.  Psychiatric:        Behavior: Behavior is cooperative.      Diagnostic studies: deferred due to insurance stipulations that require a separate visit for testing        Marty Shaggy, MD [facility] 

## 2023-09-21 NOTE — Telephone Encounter (Signed)
 Faxed Req. For Surgical Clearance form requesting to hold methotrexate  2 weeks prior and 2 weeks after surgery.   Faxed to Dr. Jon Jacob with [facility]:  Phone: [phone] Fax: [phone]  Confirmation of receipt received.

## 2023-09-21 NOTE — Patient Instructions (Addendum)
 1. Seasonal and perennial allergic rhinitis - Because of insurance stipulations, we cannot do skin testing on the same day as your first visit. - We are all working to fight this, but for now we need to do two separate visits.  - We will know more after we do testing at the next visit.  - The skin testing visit can be squeezed in at your convenience.  - Then we can make a more full plan to address all of your symptoms. - Be sure to stop your antihistamines for 3 days before this appointment.   2. Rash - We may consider doing patch testing at some point in the future.  - Continue with triamcinolone  0.1% cream to use twice daily.  3. Return in about 1 week (around 09/28/2023) for ALLERGY TESTING (1-55 + 17). You can have the follow up appointment with Dr. Iva or a Nurse Practicioner (our Nurse Practitioners are excellent and always have Physician oversight!).    Please inform us  of any Emergency Department visits, hospitalizations, or changes in symptoms. Call us  before going to the ED for breathing or allergy symptoms since we might be able to fit you in for a sick visit. Feel free to contact us  anytime with any questions, problems, or concerns.  It was a pleasure to meet you and your husband today!  Websites that have reliable patient information: 1. American Academy of Asthma, Allergy, and Immunology: www.aaaai.org 2. Food Allergy Research and Education (FARE): foodallergy.org 3. Mothers of Asthmatics: http://www.asthmacommunitynetwork.org 4. American College of Allergy, Asthma, and Immunology: www.acaai.org      "Like" us  on Facebook and Instagram for our latest updates!      A healthy democracy works best when [employer] participate! Make sure you are registered to vote! If you have moved or changed any of your contact information, you will need to get this updated before voting! Scan the QR codes below to learn more!

## 2023-09-28 ENCOUNTER — Encounter: Payer: Self-pay | Admitting: Allergy & Immunology

## 2023-09-28 ENCOUNTER — Ambulatory Visit (INDEPENDENT_AMBULATORY_CARE_PROVIDER_SITE_OTHER): Admitting: Allergy & Immunology

## 2023-09-28 DIAGNOSIS — R21 Rash and other nonspecific skin eruption: Secondary | ICD-10-CM

## 2023-09-28 DIAGNOSIS — J3089 Other allergic rhinitis: Secondary | ICD-10-CM

## 2023-09-28 NOTE — Progress Notes (Signed)
 FOLLOW UP  Date of Service/Encounter:  09/28/23   Assessment:   Rash - sees Dr. Alm Parkers allergic rhinitis (dust mites, indoor molds)  Plan/Recommendations:   1. Seasonal and perennial allergic rhinitis - Testing today showed: indoor molds and dust mites - Copy of test results provided.  - Avoidance measures provided. - Continue with: intermittent use of your nasal symptoms. - Consider nasal saline rinses 1-2 times daily to remove allergens from the nasal cavities as well as help with mucous clearance (this is especially helpful to do before the nasal sprays are given) - We COULD do allergy  shots as a means of long-term control, but that seems overkill.   2. Rash - We may consider doing patch testing at some point in the future.  - Continue with triamcinolone  0.1% cream to use twice daily. - Continue to follow up with Dermatology.   3. Return if symptoms worsen or fail to improve.   Subjective:   Carla Lamb is a 68 y.o. female presenting today for follow up of No chief complaint on file.   Carla Lamb has a history of the following: Patient Active Problem List   Diagnosis Date Noted   S/P total right hip arthroplasty 05/26/2022   Anxiety 04/15/2022   Mild episode of depression 03/13/2022   Chronic allergic rhinitis 11/30/2021   Fatty liver 05/21/2021   Low back pain, unspecified 05/21/2021   Osteoporosis 05/21/2021   Primary osteoarthritis 05/21/2021   Raynaud's disease 05/21/2021   Rheumatoid arthritis (HCC) 05/21/2021   Status post right mastectomy 09/11/2017   Malignant neoplasm of right breast (HCC) 07/25/2013    History obtained from: chart review and patient.  Discussed the use of AI scribe software for clinical note transcription with the patient and/or guardian, who gave verbal consent to proceed.  Carla Lamb is a 68 y.o. female presenting for skin testing. She was last seen on September 2nd. We could not do testing because her insurance  company does not cover testing on the same day as a New Patient visit. She has been off of all antihistamines 3 days in anticipation of the testing.   She experiences seasonal allergy  symptoms primarily during the transitions from spring to summer and fall to winter. Symptoms include congestion and rhinorrhea, without fever. No food-related triggers for her allergies, although she notes issues with chocolate and milk, which she attributes to vitamin D .  She is undergoing evaluation for cysts, one of which has been previously drained by a different doctor.  Otherwise, there have been no changes to her past medical history, surgical history, family history, or social history.    Review of systems otherwise negative other than that mentioned in the HPI.    Objective:   There were no vitals taken for this visit. There is no height or weight on file to calculate BMI.    Physical exam deferred since this was a skin testing appointment only.   Diagnostic studies:   Allergy  Studies:     Airborne Adult Perc - 09/28/23 1021     Time Antigen Placed 1021    Allergen Manufacturer Jestine    Location Back    Number of Test 55    1. Control-Buffer 50% Glycerol Negative    2. Control-Histamine 2+    3. Bahia Negative    4. French Southern Territories Negative    5. Johnson Negative    6. Kentucky  Blue Negative    7. Meadow Fescue Negative    8. Perennial Rye  Negative    9. Timothy Negative    10. Ragweed Mix Negative    11. Cocklebur Negative    12. Plantain,  English Negative    13. Baccharis Negative    14. Dog Fennel Negative    15. Russian Thistle Negative    16. Lamb's Quarters Negative    17. Sheep Sorrell Negative    18. Rough Pigweed Negative    19. Marsh Elder, Rough Negative    20. Mugwort, Common Negative    21. Box, Elder Negative    22. Cedar, red Negative    23. Sweet Gum Negative    24. Pecan Pollen Negative    25. Pine Mix Negative    26. Walnut, Black Pollen Negative    27. Red  Mulberry Negative    28. Ash Mix Negative    29. Birch Mix Negative    30. Beech American Negative    31. Cottonwood, Guinea-Bissau Negative    32. Hickory, White Negative    33. Maple Mix Negative    34. Oak, Guinea-Bissau Mix Negative    35. Sycamore Eastern Negative    36. Alternaria Alternata Negative    37. Cladosporium Herbarum Negative    38. Aspergillus Mix Negative    39. Penicillium Mix Negative    40. Bipolaris Sorokiniana (Helminthosporium) Negative    41. Drechslera Spicifera (Curvularia) Negative    42. Mucor Plumbeus Negative    43. Fusarium Moniliforme Negative    44. Aureobasidium Pullulans (pullulara) Negative    45. Rhizopus Oryzae Negative    46. Botrytis Cinera Negative    47. Epicoccum Nigrum Negative    48. Phoma Betae 2+    49. Dust Mite Mix 2+    50. Cat Hair 10,000 BAU/ml Negative    51.  Dog Epithelia Negative    52. Mixed Feathers Negative    53. Horse Epithelia Negative    54. Cockroach, German Negative    55. Tobacco Leaf Negative          Intradermal - 09/28/23 1108     Time Antigen Placed 1155    Allergen Manufacturer Jestine    Location Arm    Number of Test 15    Intradermal Select    Control Negative    Bahia Negative    French Southern Territories Negative    Johnson Negative    7 Grass Negative    Ragweed Mix Negative    Weed Mix Negative    Tree Mix Negative    Mold 1 Negative    Mold 2 Negative    Mold 3 Negative    Mold 4 Negative    Cat Negative    Dog Negative    Cockroach Negative          Food Adult Perc - 09/28/23 1100     Time Antigen Placed 1030    Allergen Manufacturer Greer    Location Back    Number of allergen test 17    1. Peanut Negative    2. Soybean Negative    3. Wheat Negative    4. Sesame Negative    5. Milk, Cow Negative    6. Casein Negative    7. Egg White, Chicken Negative    8. Shellfish Mix Negative    9. Fish Mix Negative    10. Cashew Negative    11. Walnut Food Negative    12. Almond Negative    13. Hazelnut  Negative    14. Pecan Food Negative  15. Pistachio Negative    16. Eston[place] Nut Negative    17. Coconut Negative          Allergy  testing results were read and interpreted by myself, documented by clinical staff.      Marty Shaggy, MD  [facility] [place] 

## 2023-09-28 NOTE — Patient Instructions (Addendum)
 1. Seasonal and perennial allergic rhinitis - Testing today showed: indoor molds and dust mites - Copy of test results provided.  - Avoidance measures provided. - Continue with: intermittent use of your nasal symptoms. - Consider nasal saline rinses 1-2 times daily to remove allergens from the nasal cavities as well as help with mucous clearance (this is especially helpful to do before the nasal sprays are given) - We COULD do allergy  shots as a means of long-term control, but that seems overkill.   2. Rash - We may consider doing patch testing at some point in the future.  - Continue with triamcinolone  0.1% cream to use twice daily. - Continue to follow up with Dermatology.   3. Return if symptoms worsen or fail to improve.   Please inform us  of any Emergency Department visits, hospitalizations, or changes in symptoms. Call us  before going to the ED for breathing or allergy  symptoms since we might be able to fit you in for a sick visit. Feel free to contact us  anytime with any questions, problems, or concerns.  It was a pleasure to see you again today.   Websites that have reliable patient information: 1. American Academy of Asthma, Allergy , and Immunology: www.aaaai.org 2. Food Allergy  Research and Education (FARE): foodallergy.org 3. Mothers of Asthmatics: http://www.asthmacommunitynetwork.org 4. [employer] of Allergy , Asthma, and Immunology: www.acaai.org      "Like" us  on Facebook and Instagram for our latest updates!      A healthy democracy works best when [employer] participate! Make sure you are registered to vote! If you have moved or changed any of your contact information, you will need to get this updated before voting! Scan the QR codes below to learn more!         Airborne Adult Perc - 09/28/23 1021     Time Antigen Placed 1021    Allergen Manufacturer Jestine    Location Back    Number of Test 55    1. Control-Buffer 50% Glycerol Negative    2.  Control-Histamine 2+    3. Bahia Negative    4. French Southern Territories Negative    5. Johnson Negative    6. [place]  Blue Negative    7. Meadow Fescue Negative    8. Perennial Rye Negative    9. Timothy Negative    10. Ragweed Mix Negative    11. Cocklebur Negative    12. Plantain,  English Negative    13. Baccharis Negative    14. Dog Fennel Negative    15. Russian Thistle Negative    16. Lamb's Quarters Negative    17. Sheep Sorrell Negative    18. Rough Pigweed Negative    19. Marsh Elder, Rough Negative    20. Mugwort, Common Negative    21. Box, Elder Negative    22. Cedar, red Negative    23. Sweet Gum Negative    24. Pecan Pollen Negative    25. Pine Mix Negative    26. Walnut, Black Pollen Negative    27. Red Mulberry Negative    28. Ash Mix Negative    29. Birch Mix Negative    30. Beech American Negative    31. Cottonwood, Guinea-Bissau Negative    32. Hickory, White Negative    33. Maple Mix Negative    34. Oak, Guinea-[place] Mix Negative    35. Sycamore Eastern Negative    36. Alternaria Alternata Negative    37. Cladosporium Herbarum Negative    38. Aspergillus Mix  Negative    39. Penicillium Mix Negative    40. Bipolaris Sorokiniana (Helminthosporium) Negative    41. Drechslera Spicifera (Curvularia) Negative    42. Mucor Plumbeus Negative    43. Fusarium Moniliforme Negative    44. Aureobasidium Pullulans (pullulara) Negative    45. Rhizopus Oryzae Negative    46. Botrytis Cinera Negative    47. Epicoccum Nigrum Negative    48. Phoma Betae 2+    49. Dust Mite Mix 2+    50. Cat Hair 10,000 BAU/ml Negative    51.  Dog Epithelia Negative    52. Mixed Feathers Negative    53. Horse Epithelia Negative    54. Cockroach, German Negative    55. Tobacco Leaf Negative          Intradermal - 09/28/23 1108     Time Antigen Placed 1155    Allergen Manufacturer Jestine    Location Arm    Number of Test 15    Intradermal Select    Control Negative    Bahia Negative    French Southern Territories  Negative    Johnson Negative    7 Grass Negative    Ragweed Mix Negative    Weed Mix Negative    Tree Mix Negative    Mold 1 Negative    Mold 2 Negative    Mold 3 Negative    Mold 4 Negative    Cat Negative    Dog Negative    Cockroach Negative          Food Adult Perc - 09/28/23 1100     Time Antigen Placed 1030    Allergen Manufacturer Greer    Location Back    Number of allergen test 17    1. Peanut Negative    2. Soybean Negative    3. Wheat Negative    4. Sesame Negative    5. Milk, Cow Negative    6. Casein Negative    7. Egg White, Chicken Negative    8. Shellfish Mix Negative    9. Fish Mix Negative    10. Cashew Negative    11. Walnut Food Negative    12. Almond Negative    13. Hazelnut Negative    14. Pecan Food Negative    15. Pistachio Negative    16. [place] Nut Negative    17. Coconut Negative          Control of Dust Mite Allergen    Dust mites play a major role in allergic asthma and rhinitis.  They occur in environments with high humidity wherever human skin is found.  Dust mites absorb humidity from the atmosphere (ie, they do not drink) and feed on organic matter (including shed human and animal skin).  Dust mites are a microscopic type of insect that you cannot see with the naked eye.  High levels of dust mites have been detected from mattresses, pillows, carpets, upholstered furniture, bed covers, clothes, soft toys and any woven material.  The principal allergen of the dust mite is found in its feces.  A gram of dust may contain 1,000 mites and 250,000 fecal particles.  Mite antigen is easily measured in the air during house cleaning activities.  Dust mites do not bite and do not cause harm to humans, other than by triggering allergies/asthma.    Ways to decrease your exposure to dust mites in your home:  Encase mattresses, box springs and pillows with a mite-impermeable barrier or cover   Wash  sheets, blankets and drapes weekly in hot water   (130 F) with detergent and dry them in a dryer on the hot setting.  Have the room cleaned frequently with a vacuum cleaner and a damp dust-mop.  For carpeting or rugs, vacuuming with a vacuum cleaner equipped with a high-efficiency particulate air (HEPA) filter.  The dust mite allergic individual should not be in a room which is being cleaned and should wait 1 hour after cleaning before going into the room. Do not sleep on upholstered furniture (eg, couches).   If possible removing carpeting, upholstered furniture and drapery from the home is ideal.  Horizontal blinds should be eliminated in the rooms where the person spends the most time (bedroom, study, television room).  Washable vinyl, roller-type shades are optimal. Remove all non-washable stuffed toys from the bedroom.  Wash stuffed toys weekly like sheets and blankets above.   Reduce indoor humidity to less than 50%.  Inexpensive humidity monitors can be purchased at most hardware stores.  Do not use a humidifier as can make the problem worse and are not recommended.  Control of Mold Allergen   Mold and fungi can grow on a variety of surfaces provided certain temperature and moisture conditions exist.  Outdoor molds grow on plants, decaying vegetation and soil.  The major outdoor mold, Alternaria and Cladosporium, are found in very high numbers during hot and dry conditions.  Generally, a late Summer - Fall peak is seen for common outdoor fungal spores.  Rain will temporarily lower outdoor mold spore count, but counts rise rapidly when the rainy period ends.  The most important indoor molds are Aspergillus and Penicillium.  Dark, humid and poorly ventilated basements are ideal sites for mold growth.  The next most common sites of mold growth are the bathroom and the kitchen.    Indoor (Perennial) Mold Control   Positive indoor molds via skin testing: Phoma  Maintain humidity below 50%. Clean washable surfaces with 5% bleach  solution. Remove sources e.g. contaminated carpets.

## 2023-10-11 ENCOUNTER — Ambulatory Visit (INDEPENDENT_AMBULATORY_CARE_PROVIDER_SITE_OTHER)

## 2023-10-11 VITALS — BP 144/95 | HR 77 | Temp 97.5°F | Ht 64.0 in | Wt 126.0 lb

## 2023-10-11 DIAGNOSIS — Z130 Encounter for screening for diseases of the blood and blood-forming organs and certain disorders involving the immune mechanism: Secondary | ICD-10-CM

## 2023-10-11 DIAGNOSIS — Z131 Encounter for screening for diabetes mellitus: Secondary | ICD-10-CM

## 2023-10-11 DIAGNOSIS — M059 Rheumatoid arthritis with rheumatoid factor, unspecified: Secondary | ICD-10-CM

## 2023-10-11 DIAGNOSIS — Z1159 Encounter for screening for other viral diseases: Secondary | ICD-10-CM

## 2023-10-11 DIAGNOSIS — Z0000 Encounter for general adult medical examination without abnormal findings: Secondary | ICD-10-CM | POA: Insufficient documentation

## 2023-10-11 DIAGNOSIS — Z1231 Encounter for screening mammogram for malignant neoplasm of breast: Secondary | ICD-10-CM

## 2023-10-11 DIAGNOSIS — F17200 Nicotine dependence, unspecified, uncomplicated: Secondary | ICD-10-CM | POA: Insufficient documentation

## 2023-10-11 DIAGNOSIS — R200 Anesthesia of skin: Secondary | ICD-10-CM

## 2023-10-11 DIAGNOSIS — R202 Paresthesia of skin: Secondary | ICD-10-CM | POA: Insufficient documentation

## 2023-10-11 DIAGNOSIS — Z23 Encounter for immunization: Secondary | ICD-10-CM

## 2023-10-11 NOTE — Assessment & Plan Note (Signed)
 Mammo and colon cancer screening UTD. Declines flu and covid vaccines today. Updating TDAP today. Updating labs today (see orders).

## 2023-10-11 NOTE — Assessment & Plan Note (Signed)
 Follows with [facility] with labs every 8 weeks. On Methotrexate  15 mg once weekly and hydroychloroquine 400 mg every day. Continue regular follow ups with lab monitoring. Will cont to monitor.

## 2023-10-11 NOTE — Assessment & Plan Note (Signed)
 Symptoms consistent with peripheral neuropathy. Will refer to PM&R for nerve conduction study to confirm and consider treatment with gabapentin in the future.

## 2023-10-11 NOTE — Patient Instructions (Signed)
-   Continue all of your current medications as prescribed  - I have placed an order for a nerve conduction study to determine if neuropathy is the cause of your lower leg tingling  - I have also placed the referral for your mammogram  - please call the plastic surgery office to determine alternative options to treat your lipoma. If you decide you want to try Chantix, please let me know as your insurance covers it.  - We are updating your TDAP vaccine today and then you will be up to date on all of your screenings.   - I will be in touch with you via MyChart with your lab results.   It was nice to meet you!   If you have any problems before your next visit feel free to message me via MyChart (minor issues or questions) or call the office, otherwise you may reach out to schedule an office visit.  Thank you! Saddie Sacks, PA-C

## 2023-10-11 NOTE — Progress Notes (Signed)
 Established Patient Office Visit  Subjective   Patient ID: Carla Lamb, female    DOB: [birth year]  Age: 68 y.o. MRN: [id number]  Chief Complaint  Patient presents with   Annual Exam    Physical    HPI  Carla Lamb is a 68 y.o. female who presents to the clinic for annual visit. She reports that she is overall feeling well and sleeping well. Reports regular bowel movements. Has been exercising as tolerated and eating well balanced. She does have additional issues to discuss today.   Lipoma of back  -Patient reports that she had had a lipoma diagnosed by [facility] dermatology. She has had this for several years and reports that she is scheduled with plastic surgery to have this removed, however the surgeon told her that she needed to quit smoking completely before surgery without nicotine replacement.   Patient has significantly cut back on her smoking quantity. Used to smoke 2.5 ppw and is now down to 1 ppw or 5 cigarettes per day. Is interested in trying non-nicotine replacement medication options like Chantix, but wants to call the plastic surgeon office first to see if they can send her somewhere that does not require complete cessation.   Patient also reports intermittent numbness and tingling in both feet (R>L). Sensation started in just the toes of the right foot but patient now reports that it has started to crawl up the leg and up to the right knee. The sensation is the left foot is localized to the toes only. Reports symptoms mainly bother her at night.      ROS Per HPI.    Objective:     BP (!) 144/95 (BP Location: Left Arm, Patient Position: Sitting, Cuff Size: Normal)   Pulse 77   Temp (!) 97.5 F (36.4 C) (Oral)   Ht 5' 4 (1.626 m)   Wt 126 lb 0.6 oz (57.2 kg)   SpO2 100%   BMI 21.63 kg/m    Physical Exam Constitutional:      General: She is not in acute distress.    Appearance: Normal appearance.  HENT:     Right Ear: Tympanic membrane normal.     Left  Ear: Tympanic membrane normal.     Mouth/Throat:     Mouth: Mucous membranes are moist.     Pharynx: Oropharynx is clear.  Eyes:     Pupils: Pupils are equal, round, and reactive to light.  Cardiovascular:     Rate and Rhythm: Normal rate and regular rhythm.     Heart sounds: Normal heart sounds. No murmur heard.    No friction rub. No gallop.  Pulmonary:     Effort: Pulmonary effort is normal. No respiratory distress.     Breath sounds: Normal breath sounds.  Abdominal:     General: Abdomen is flat. Bowel sounds are normal.     Palpations: Abdomen is soft.  Musculoskeletal:        General: No swelling.     Cervical back: Normal range of motion.  Lymphadenopathy:     Cervical: No cervical adenopathy.  Skin:    General: Skin is warm and dry.  Neurological:     General: No focal deficit present.     Mental Status: She is alert.  Psychiatric:        Mood and Affect: Mood normal.        Behavior: Behavior normal.        Thought Content: Thought content normal.  No results found for any visits on 10/11/23.    The ASCVD Risk score (Arnett DK, et al., 2019) failed to calculate for the following reasons:   The valid HDL cholesterol range is 20 to 100 mg/dL    Assessment & Plan:   Screening for endocrine, nutritional, metabolic and immunity disorder -     TSH; Future -     Lipid panel; Future -     Hemoglobin A1c; Future  Screening for viral disease -     Hepatitis C antibody; Future  Encounter for vaccination -     Tdap vaccine greater than or equal to 7yo IM  Screening for diabetes mellitus -     Hemoglobin A1c; Future  Numbness and tingling of both feet Assessment & Plan: Symptoms consistent with peripheral neuropathy. Will refer to PM&R for nerve conduction study to confirm and consider treatment with gabapentin in the future.  Orders: -     Ambulatory referral to Physical Medicine Rehab  Screening mammogram for breast cancer -     3D Screening  Mammogram, Left and Right; Future  Rheumatoid arthritis with positive rheumatoid factor, involving unspecified site [facility]) Assessment & Plan: Follows with [facility] with labs every 8 weeks. On Methotrexate  15 mg once weekly and hydroychloroquine 400 mg every day. Continue regular follow ups with lab monitoring. Will cont to monitor.   Tobacco use disorder Assessment & Plan: Patient has reduced her smoking from 2.5 ppw to 1 ppw (5 cigarettes per day). Her plastic surgeon for her lipoma is requiring complete and total cessation before they will operate. Cannot use nicotine replacement because they will test for nicotine metabolites before surgery. Insurance will cover Chantix. Patient would like to think about this and will notify if this is something she wants to go through with.    Wellness examination Assessment & Plan: Mammo and colon cancer screening UTD. Declines flu and covid vaccines today. Updating TDAP today. Updating labs today (see orders).      Return in about 1 year (around 10/10/2024) for Physical.    Carla JULIANNA Sacks, PA-C

## 2023-10-11 NOTE — Assessment & Plan Note (Signed)
 Patient has reduced her smoking from 2.5 ppw to 1 ppw (5 cigarettes per day). Her plastic surgeon for her lipoma is requiring complete and total cessation before they will operate. Cannot use nicotine replacement because they will test for nicotine metabolites before surgery. Insurance will cover Chantix. Patient would like to think about this and will notify if this is something she wants to go through with.

## 2023-10-13 ENCOUNTER — Other Ambulatory Visit: Payer: Self-pay

## 2023-10-13 ENCOUNTER — Telehealth: Payer: Self-pay | Admitting: *Deleted

## 2023-10-13 DIAGNOSIS — Z9010 Acquired absence of unspecified breast and nipple: Secondary | ICD-10-CM

## 2023-10-13 NOTE — Telephone Encounter (Signed)
 Spoke to pt and she stated she has not heard back from neurosurgeon as of our phone call, however she needs a DME order for masectomy supplies [facility].  Once this is completed please give to me and I will fax.

## 2023-10-13 NOTE — Telephone Encounter (Signed)
 Copied from CRM [id number]. Topic: General - Other >> Oct 13, 2023  1:16 PM Rachelle R wrote: Reason for CRM: Patient calling to advised she just spoke to her neurosurgeon to see if they are able to do treatment/diagnosis for neuropathy. Wanted to provide an uptdate.  Patient can be reached at [phone]

## 2023-10-14 ENCOUNTER — Other Ambulatory Visit: Payer: Self-pay

## 2023-10-15 NOTE — Addendum Note (Signed)
 Addended byBETHA GAYLE NUMBERS on: 10/15/2023 08:41 AM   Modules accepted: Orders

## 2023-11-05 ENCOUNTER — Ambulatory Visit: Admitting: Surgical

## 2023-11-08 ENCOUNTER — Encounter: Payer: Self-pay | Admitting: Physical Medicine & Rehabilitation

## 2023-12-14 ENCOUNTER — Telehealth: Payer: Self-pay

## 2023-12-14 ENCOUNTER — Other Ambulatory Visit: Payer: Self-pay | Admitting: *Deleted

## 2023-12-14 MED ORDER — DENOSUMAB 60 MG/ML ~~LOC~~ SOSY
60.0000 mg | PREFILLED_SYRINGE | SUBCUTANEOUS | Status: AC
  Administered 2023-12-30: 60 mg via SUBCUTANEOUS

## 2023-12-14 NOTE — Telephone Encounter (Signed)
 Prolia VOB initiated via [email or web address]  Next Prolia inj DUE: 02/10/23

## 2023-12-15 ENCOUNTER — Other Ambulatory Visit (HOSPITAL_COMMUNITY): Payer: Self-pay

## 2023-12-15 NOTE — Telephone Encounter (Signed)
 Carla Lamb

## 2023-12-15 NOTE — Telephone Encounter (Signed)
 Buy/Bill (Office supplied medication)  Out-of-pocket cost due at time of clinic visit: $0  Number of injection/visits approved: ---  Primary: MEDICARE Co-insurance: 0% Admin fee co-insurance: 0%  Secondary: AETNA-MEDSUP Co-insurance:  Admin fee co-insurance:   Medical Benefit Details: Date Benefits were checked: 12/14/23 Deductible: $257 Met of $257 Required/ Coinsurance: 0%/ Admin Fee: 0%  Prior Auth: N/A PA# Expiration Date:   # of doses approved: -----------------------------------------------------------------------  Patient NOT eligible for Copay Card. Copay Card can make patient's cost as little as $25. Link to apply: [email or web address]://[email or web address]  ** This summary of benefits is an estimation of the patient's out-of-pocket cost. Exact cost may very based on individual plan coverage.

## 2023-12-22 NOTE — Telephone Encounter (Signed)
 See referral

## 2023-12-22 NOTE — Telephone Encounter (Signed)
 Patient returned a call to [facility] & would like a call back.

## 2023-12-23 NOTE — Telephone Encounter (Signed)
 Patient scheduled 12/30/23. See referral.

## 2023-12-30 ENCOUNTER — Ambulatory Visit (INDEPENDENT_AMBULATORY_CARE_PROVIDER_SITE_OTHER)

## 2023-12-30 DIAGNOSIS — M810 Age-related osteoporosis without current pathological fracture: Secondary | ICD-10-CM | POA: Diagnosis not present

## 2024-01-02 ENCOUNTER — Other Ambulatory Visit: Payer: Self-pay

## 2024-01-02 DIAGNOSIS — F419 Anxiety disorder, unspecified: Secondary | ICD-10-CM

## 2024-01-06 ENCOUNTER — Encounter: Payer: Self-pay | Admitting: Physical Medicine & Rehabilitation

## 2024-01-06 ENCOUNTER — Encounter: Attending: Physical Medicine & Rehabilitation | Admitting: Physical Medicine & Rehabilitation

## 2024-01-06 VITALS — BP 137/75 | HR 98 | Ht 64.0 in | Wt 131.0 lb

## 2024-01-06 DIAGNOSIS — R202 Paresthesia of skin: Secondary | ICD-10-CM | POA: Diagnosis not present

## 2024-01-06 DIAGNOSIS — R200 Anesthesia of skin: Secondary | ICD-10-CM | POA: Insufficient documentation

## 2024-01-06 NOTE — Progress Notes (Signed)
 EMG/NCV of bilateral lower limbs requested by primary care provider to evaluate lower extremity numbness.  Please refer to electrodiagnostic report under chart review media tab.  Patient is to follow-up with referring provider to review results

## 2024-01-10 LAB — OPHTHALMOLOGY REPORT-SCANNED

## 2024-02-02 ENCOUNTER — Encounter: Payer: Self-pay | Admitting: Physical Medicine & Rehabilitation

## 2024-02-16 ENCOUNTER — Encounter: Payer: Self-pay | Admitting: Dermatology

## 2024-02-16 ENCOUNTER — Ambulatory Visit: Admitting: Dermatology

## 2024-02-16 VITALS — BP 121/75

## 2024-02-16 DIAGNOSIS — D2239 Melanocytic nevi of other parts of face: Secondary | ICD-10-CM

## 2024-02-16 DIAGNOSIS — W908XXA Exposure to other nonionizing radiation, initial encounter: Secondary | ICD-10-CM

## 2024-02-16 DIAGNOSIS — L578 Other skin changes due to chronic exposure to nonionizing radiation: Secondary | ICD-10-CM

## 2024-02-16 DIAGNOSIS — D229 Melanocytic nevi, unspecified: Secondary | ICD-10-CM | POA: Diagnosis not present

## 2024-02-16 DIAGNOSIS — L570 Actinic keratosis: Secondary | ICD-10-CM

## 2024-02-16 MED ORDER — FLUOROURACIL 5 % EX CREA: TOPICAL_CREAM | Freq: Two times a day (BID) | CUTANEOUS | 0 refills | Status: AC

## 2024-02-16 MED ORDER — TRIAMCINOLONE ACETONIDE 0.1 % EX OINT: 1.0000 | TOPICAL_OINTMENT | Freq: Two times a day (BID) | CUTANEOUS | 0 refills | Status: AC

## 2024-02-16 NOTE — Patient Instructions (Addendum)
 VISIT SUMMARY:  During your visit, we addressed the irritated mole on your forehead and the longstanding spot on your nose. We discussed your history of sun exposure and smoking, which have contributed to your current skin conditions. We performed a procedure to remove the mole and provided treatment for the spot on your nose and cheeks.  YOUR PLAN:  -IRRITATED MELANOCYTIC NEVUS OF THE FOREHEAD:  An irritated melanocytic nevus is a mole that has become bothersome and has increased in size. We performed a shave removal of the mole and sent the tissue for examination to ensure it is not harmful. Please apply Aquaphor to the excision site to help with healing.  -ACTINIC KERATOSIS OF THE FACE ARMS AND CHEST:  Actinic keratosis is a rough, scaly patch on the skin caused by years of sun exposure. It can potentially develop into skin cancer if left untreated.   We prescribed 5-fluorouracil  (Efudex ) cream to apply on your nose and cheeks twice daily for two weeks. Avoid sun exposure during this treatment and use sunscreen on the rest of your body and face.   After two weeks, apply triamcinolone  healing ointment  twice a day for 2 weeks to help with healing. Be aware of potential side effects like facial redness and plan to avoid important social events during treatment.  INSTRUCTIONS:  Please follow up after the treatment period to assess the healing process and review the results of the histopathological examination of the removed mole. Patient Handout: Wound Care for Skin Biopsy Site  Taking Care of Your Skin Biopsy Site  Proper care of the biopsy site is essential for promoting healing and minimizing scarring. This handout provides instructions on how to care for your biopsy site to ensure optimal recovery.  1. Cleaning the Wound:  Clean the biopsy site daily with gentle soap and water . Gently pat the area dry with a clean, soft towel. Avoid harsh scrubbing or rubbing the area, as this can  irritate the skin and delay healing.  2. Applying Aquaphor and Bandage:  After cleaning the wound, apply a thin layer of Aquaphor ointment to the biopsy site. Cover the area with a sterile bandage to protect it from dirt, bacteria, and friction. Change the bandage daily or as needed if it becomes soiled or wet.  3. Continued Care for One Week:  Repeat the cleaning, Aquaphor application, and bandaging process daily for one week following the biopsy procedure. Keeping the wound clean and moist during this initial healing period will help prevent infection and promote optimal healing.  4. Massaging Aquaphor into the Area:  ---After one week, discontinue the use of bandages but continue to apply Aquaphor to the biopsy site. ----Gently massage the Aquaphor into the area using circular motions. ---Massaging the skin helps to promote circulation and prevent the formation of scar tissue.   Additional Tips:  Avoid exposing the biopsy site to direct sunlight during the healing process, as this can cause hyperpigmentation or worsen scarring. If you experience any signs of infection, such as increased redness, swelling, warmth, or drainage from the wound, contact your healthcare provider immediately. Follow any additional instructions provided by your healthcare provider for caring for the biopsy site and managing any discomfort. Conclusion:  Taking proper care of your skin biopsy site is crucial for ensuring optimal healing and minimizing scarring. By following these instructions for cleaning, applying Aquaphor, and massaging the area, you can promote a smooth and successful recovery. If you have any questions or concerns about caring for  your biopsy site, don't hesitate to contact your healthcare provider for guidance.     Important Information  Due to recent changes in healthcare laws, you may see results of your pathology and/or laboratory studies on MyChart before the doctors have had a  chance to review them. We understand that in some cases there may be results that are confusing or concerning to you. Please understand that not all results are received at the same time and often the doctors may need to interpret multiple results in order to provide you with the best plan of care or course of treatment. Therefore, we ask that you please give us  2 business days to thoroughly review all your results before contacting the office for clarification. Should we see a critical lab result, you will be contacted sooner.   If You Need Anything After Your Visit  If you have any questions or concerns for your doctor, please call our main line at [phone] If no one answers, please leave a voicemail as directed and we will return your call as soon as possible. Messages left after 4 pm will be answered the following business day.   You may also send us  a message via MyChart. We typically respond to MyChart messages within 1-2 business days.  For prescription refills, please ask your pharmacy to contact our office. Our fax number is [phone].  If you have an urgent issue when the clinic is closed that cannot wait until the next business day, you can page your doctor at the number below.    Please note that while we do our best to be available for urgent issues outside of office hours, we are not available 24/7.   If you have an urgent issue and are unable to reach us , you may choose to seek medical care at your doctor's office, retail clinic, urgent care center, or emergency room.  If you have a medical emergency, please immediately call 911 or go to the emergency department. In the event of inclement weather, please call our main line at [phone] for an update on the status of any delays or closures.  Dermatology Medication Tips: Please keep the boxes that topical medications come in in order to help keep track of the instructions about where and how to use these. Pharmacies  typically print the medication instructions only on the boxes and not directly on the medication tubes.   If your medication is too expensive, please contact our office at [phone] or send us  a message through MyChart.   We are unable to tell what your co-pay for medications will be in advance as this is different depending on your insurance coverage. However, we may be able to find a substitute medication at lower cost or fill out paperwork to get insurance to cover a needed medication.   If a prior authorization is required to get your medication covered by your insurance company, please allow us  1-2 business days to complete this process.  Drug prices often vary depending on where the prescription is filled and some pharmacies may offer cheaper prices.  The website www.goodrx.com contains coupons for medications through different pharmacies. The prices here do not account for what the cost may be with help from insurance (it may be cheaper with your insurance), but the website can give you the price if you did not use any insurance.  - You can print the associated coupon and take it with your prescription to the pharmacy.  - You may also stop  by our office during regular business hours and pick up a GoodRx coupon card.  - If you need your prescription sent electronically to a different pharmacy, notify our office through [facility] or by phone at [phone]

## 2024-02-16 NOTE — Progress Notes (Signed)
 "  Follow-Up Visit   Subjective  Carla Lamb is a 69 y.o. female established patient who presents for FOLLOW UP on the diagnoses listed below:  Patient was last evaluated on 09/08/23.   Irritant Contact Derm: Pt stated that Sx has resolved on B/L arms after using TMC for 2 weeks BID. No other concerns in regard today.   Spot Check: Pt has dry skin on her nose that she would like evaluated & a mole on her forehead that has been there for several years. Not painful or itchy.   Epidermal Inclusion Cyst: Pt had consultation with Plastics on 09/17/23 but did not proceed with removal.    The following portions of the chart were reviewed this encounter and updated as appropriate: medications, allergies, medical history  Review of Systems:  No other skin or systemic complaints except as noted in HPI or Assessment and Plan.  Objective  Well appearing patient in no apparent distress; mood and affect are within normal limits.   A focused examination was performed of the following areas: face   Relevant exam findings are noted in the Assessment and Plan.      Right Forehead Irritated pink brown papule  Assessment & Plan   ACTINIC KERATOSIS Exam: Erythematous thin papules/macules with gritty scale at the nose & cheeks  Actinic keratoses are precancerous spots that appear secondary to cumulative UV radiation exposure/sun exposure over time. They are chronic with expected duration over 1 year. A portion of actinic keratoses will progress to squamous cell carcinoma of the skin. It is not possible to reliably predict which spots will progress to skin cancer and so treatment is recommended to prevent development of skin cancer.  Recommend daily broad spectrum sunscreen SPF 30+ to sun-exposed areas, reapply every 2 hours as needed.  Recommend staying in the shade or wearing long sleeves, sun glasses (UVA+UVB protection) and wide brim hats (4-inch brim around the entire circumference of the  hat). Call for new or changing lesions.  Treatment Plan: - Rx Efudex  - apply BID to the nose for 2 weeks.  - Rx TMC oint - apply BID for 2 weeks.    ACTINIC DAMAGE - chronic, secondary to cumulative UV radiation exposure/sun exposure over time - diffuse scaly erythematous macules with underlying dyspigmentation - Recommend daily broad spectrum sunscreen SPF 30+ to sun-exposed areas, reapply every 2 hours as needed.  - Recommend staying in the shade or wearing long sleeves, sun glasses (UVA+UVB protection) and wide brim hats (4-inch brim around the entire circumference of the hat). - Call for new or changing lesions.  IRRITATED NEVUS Right Forehead - Epidermal / dermal shaving - Right Forehead  Lesion diameter (cm):  0.6 Informed consent: discussed and consent obtained   Timeout: patient name, date of birth, surgical site, and procedure verified   Procedure prep:  Patient was prepped and draped in usual sterile fashion Prep type:  Isopropyl alcohol Anesthesia: the lesion was anesthetized in a standard fashion   Anesthetic:  1% lidocaine  w/ epinephrine 1-100,000 buffered w/ 8.4% NaHCO3 Instrument used: DermaBlade   Hemostasis achieved with: aluminum chloride   Outcome: patient tolerated procedure well   Post-procedure details: sterile dressing applied and wound care instructions given   Dressing type: petrolatum   Additional details:  Pt aware that benign results will be sent to mychart and the staff will call abnormal results will  Specimen 1 - Surgical pathology Differential Diagnosis: irritated nevus   Check Margins: yes ACTINIC KERATOSIS   This  Visit - fluorouracil  (EFUDEX ) 5 % cream - Apply topically 2 (two) times daily. Use on the cheeks and nose for 2 weeks then stop. - triamcinolone  ointment (KENALOG ) 0.1 % - Apply 1 Application topically 2 (two) times daily. Use for 2 weeks after Efudex  treatment then stop.  Return if symptoms worsen or fail to  improve.   Documentation: I have reviewed the above documentation for accuracy and completeness, and I agree with the above.  I, Shirron Maranda, CMA II, am acting as scribe for:  Delon Lenis, DO "

## 2024-02-17 LAB — SURGICAL PATHOLOGY

## 2024-02-22 ENCOUNTER — Ambulatory Visit: Payer: Self-pay | Admitting: Dermatology

## 2024-09-21 ENCOUNTER — Ambulatory Visit
